# Patient Record
Sex: Male | Born: 1955 | Race: White | Hispanic: No | Marital: Married | Smoking: Never smoker
Health system: Southern US, Community
[De-identification: ages and names within clinical notes are randomized; demographics above are authoritative.]

## PROBLEM LIST (undated history)

## (undated) DIAGNOSIS — R55 Syncope and collapse: Secondary | ICD-10-CM

## (undated) DIAGNOSIS — J42 Unspecified chronic bronchitis: Secondary | ICD-10-CM

## (undated) DIAGNOSIS — I2510 Atherosclerotic heart disease of native coronary artery without angina pectoris: Secondary | ICD-10-CM

## (undated) DIAGNOSIS — Z8701 Personal history of pneumonia (recurrent): Secondary | ICD-10-CM

## (undated) DIAGNOSIS — I208 Other forms of angina pectoris: Secondary | ICD-10-CM

## (undated) DIAGNOSIS — E8881 Metabolic syndrome: Secondary | ICD-10-CM

## (undated) DIAGNOSIS — Z9289 Personal history of other medical treatment: Secondary | ICD-10-CM

## (undated) DIAGNOSIS — I2119 ST elevation (STEMI) myocardial infarction involving other coronary artery of inferior wall: Secondary | ICD-10-CM

## (undated) DIAGNOSIS — I10 Essential (primary) hypertension: Secondary | ICD-10-CM

## (undated) DIAGNOSIS — E785 Hyperlipidemia, unspecified: Secondary | ICD-10-CM

## (undated) DIAGNOSIS — N200 Calculus of kidney: Secondary | ICD-10-CM

## (undated) DIAGNOSIS — Z9861 Coronary angioplasty status: Secondary | ICD-10-CM

## (undated) DIAGNOSIS — E669 Obesity, unspecified: Secondary | ICD-10-CM

## (undated) HISTORY — DX: Personal history of pneumonia (recurrent): Z87.01

## (undated) HISTORY — DX: Coronary angioplasty status: Z98.61

## (undated) HISTORY — DX: Syncope and collapse: R55

## (undated) HISTORY — DX: Personal history of other medical treatment: Z92.89

## (undated) HISTORY — PX: KNEE ARTHROSCOPY: SHX127

## (undated) HISTORY — DX: Atherosclerotic heart disease of native coronary artery without angina pectoris: I25.10

---
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
## 1994-03-08 HISTORY — PX: ANAL SPHINCTEROTOMY: SHX1140

## 1997-03-08 HISTORY — PX: LITHOTRIPSY: SUR834

## 2001-03-08 DIAGNOSIS — Z8701 Personal history of pneumonia (recurrent): Secondary | ICD-10-CM

## 2001-03-08 HISTORY — DX: Personal history of pneumonia (recurrent): Z87.01

## 2003-08-05 ENCOUNTER — Emergency Department (HOSPITAL_COMMUNITY): Admission: EM | Admit: 2003-08-05 | Discharge: 2003-08-05 | Payer: Self-pay | Admitting: Emergency Medicine

## 2003-08-06 ENCOUNTER — Emergency Department (HOSPITAL_COMMUNITY): Admission: EM | Admit: 2003-08-06 | Discharge: 2003-08-06 | Payer: Self-pay | Admitting: Emergency Medicine

## 2005-07-08 ENCOUNTER — Ambulatory Visit: Payer: Self-pay | Admitting: Internal Medicine

## 2005-07-08 ENCOUNTER — Inpatient Hospital Stay (HOSPITAL_COMMUNITY): Admission: EM | Admit: 2005-07-08 | Discharge: 2005-07-09 | Payer: Self-pay | Admitting: Emergency Medicine

## 2005-07-13 ENCOUNTER — Ambulatory Visit: Payer: Self-pay

## 2005-07-13 ENCOUNTER — Inpatient Hospital Stay (HOSPITAL_COMMUNITY): Admission: EM | Admit: 2005-07-13 | Discharge: 2005-07-14 | Payer: Self-pay | Admitting: Emergency Medicine

## 2005-07-13 ENCOUNTER — Ambulatory Visit: Payer: Self-pay | Admitting: *Deleted

## 2007-11-27 ENCOUNTER — Ambulatory Visit (HOSPITAL_BASED_OUTPATIENT_CLINIC_OR_DEPARTMENT_OTHER): Admission: RE | Admit: 2007-11-27 | Discharge: 2007-11-27 | Payer: Self-pay | Admitting: Orthopedic Surgery

## 2009-06-04 ENCOUNTER — Encounter: Admission: RE | Admit: 2009-06-04 | Discharge: 2009-06-04 | Payer: Self-pay | Admitting: Family Medicine

## 2010-02-10 ENCOUNTER — Ambulatory Visit (HOSPITAL_BASED_OUTPATIENT_CLINIC_OR_DEPARTMENT_OTHER)
Admission: RE | Admit: 2010-02-10 | Discharge: 2010-02-10 | Payer: Self-pay | Source: Home / Self Care | Admitting: Family Medicine

## 2010-07-21 NOTE — Op Note (Signed)
[name], Jason Mathis                  ACCOUNT NO.:  [id number]   MEDICAL RECORD NO.:  [id number]          PATIENT TYPE:  AMB   LOCATION:  DSC                          FACILITY:  [facility]   PHYSICIAN:  Feliberto Gottron. Turner Daniels, M.D.   DATE OF BIRTH:  1955/11/08   DATE OF PROCEDURE:  11/27/2007  DATE OF DISCHARGE:                               OPERATIVE REPORT   PREOPERATIVE DIAGNOSIS:  Left knee medial meniscal tear.   POSTOPERATIVE DIAGNOSIS:  Left knee medial meniscal tear.   PROCEDURE:  Left knee partial arthroscopic medial meniscectomy.   SURGEON:  Feliberto Gottron. Turner Daniels, MD   FIRST ASSISTANT:  None.   ANESTHETIC:  General LMA.   ESTIMATED BLOOD LOSS:  Minimal.   FLUID REPLACEMENT:  500 mL of crystalloid.   DRAINS PLACED:  None.   TOURNIQUET TIME:  None.   INDICATIONS FOR PROCEDURE:  A 55 year old gentleman with mechanical  catching, popping, and pain along the medial aspect of his left knee.  He has failed conservative treatment with anti-inflammatory medicines,  exercise observation, cortisone injection, and desires elective left  knee arthroscopic evaluation and treatment for a presumed medial  meniscal tear.  Risks and benefits of surgery discussed, questions  answered.   DESCRIPTION OF PROCEDURE:  The patient identified by armband, taken to  the operating room at [facility].  Appropriate anesthetic  monitors were attached and general LMA anesthesia induced with the  patient in supine position.  Lateral post was applied to the table and  the left lower extremity prepped and draped in usual sterile fashion  from the ankle to the midthigh.  He did receive preoperative IV  antibiotics and a standard time-out procedure was then performed.  Using  a #11 blade, standard inferomedial and inferolateral peripatellar  portals were then made allowing introduction of the arthroscope through  the inferolateral portal and the outflow through the inferomedial  portal.  Suprapatellar  pouch, patella, and trochlear were in excellent  condition.  Moving the medial side, the patient had a complex tear of  the posterior horn of the medial meniscus that looked like a bucket-  handle, it had been a shooting half.  This was removed with a 3.5 Gator  sucker shaver and straight biters, large and small.  We then thoroughly  probed the remnant of the meniscus and the rest of it was intact.  We  removed about 30% of the posterior horn.  He had mild chondromalacia of  the medial compartment, the ACL and PCL were intact on the  lateral side.  The articular and meniscal cartilages were in good  condition.  The gutters were cleared medially and laterally.  The knee  irrigated out with normal saline solution.  The arthroscopic instruments  were then removed and a dressing of Xeroform, 4 x 4 dressing sponges,  Webril, and Ace wrap were applied.      Feliberto Gottron. Turner Daniels, M.D.  Electronically Signed     FJR/MEDQ  D:  11/27/2007  T:  11/28/2007  Job:  [id number]

## 2010-07-24 NOTE — Discharge Summary (Signed)
NAMEISIDRO, MONKS                  ACCOUNT NO.:  [id number]   MEDICAL RECORD NO.:  [id number]          PATIENT TYPE:  INP   LOCATION:  3735                         FACILITY:  [facility]   PHYSICIAN:  Salvadore Farber, M.D. LHCDATE OF BIRTH:  1956/02/06   DATE OF ADMISSION:  07/08/2005  DATE OF DISCHARGE:  07/09/2005                                 DISCHARGE SUMMARY   PRIMARY CARDIOLOGIST:  Dr. Dietrich Pates.   PRINCIPAL DIAGNOSIS:  Chest pain.   OTHER DIAGNOSES:  1.  Seasonal allergies.  2.  Remote history of migraines.  3.  Remote history of nephrolithiasis.  4.  History of right knee surgery.  5.  History of sphincterotomy.  6.  History of lithotripsy.   ALLERGIES:  NO KNOWN DRUG ALLERGIES.   PROCEDURES:  None.   HISTORY OF PRESENT ILLNESS:  [age]-year-old married white male with no  prior history of coronary artery disease who was in his usual state of  health on Jul 08, 2005, when he had the sudden onset of substernal chest  tightness radiating up to the right, which was not pleuritic or positional.  There were no associated symptoms.  After about 5 or 10 minutes, symptoms  increased in intensity, prompting him to ask a friend to drive him to the  emergency room.  In the ED, he was treated with a GI cocktail with some  relief.  Decision was made to admit him and rule him out for MI.   HOSPITAL COURSE:  The patient ruled out for MI by cardiac markers.  ECG was  without acute changes.  This morning, he is not having any additional chest  discomfort.  He is being discharged home today in satisfactory condition.  He has been set up to have an exercise Myoview on May 8th, and will follow  up with Dr. Tenny Craw regarding that result.   DISCHARGE LABS:  Hemoglobin 15.3, hematocrit 45.0.  Sodium 135, potassium  3.9, chloride 104, CO2 22, BUN 10, creatinine 0.9, glucose 78, total  bilirubin 0.9, alkaline phosphatase 51, AST 23, ALT 26, total protein 6.4,  albumin 4.2, calcium 8.6.   Cardiac markers negative x3.  Total cholesterol  226, triglycerides 159, HDL 33, LDL 161.  TSH 1.020.   DISPOSITION:  The patient is being discharged home today in good condition.   FOLLOW UP PLANS AND APPOINTMENTS:  He has an exercise Myoview scheduled on  Jul 13, 2005, at 11:45 a.m., and will follow up Dr. Dietrich Pates on May 18th at  3:30 p.m..   DISCHARGE MEDICATIONS:  Aspirin 81 mg daily, Protonix 4 mg daily,  nitroglycerin 0.4 mg sublingual p.r.n. chest pain.   OUTSTANDING LAB STUDIES:  None.   DURATION OF DISCHARGE ENCOUNTER:  Thirty-five minutes including physician  time.      Ok Anis, NP      Salvadore Farber, M.D. [facility]  Electronically Signed    CRB/MEDQ  D:  07/09/2005  T:  07/10/2005  Job:  [id number]

## 2010-07-24 NOTE — Cardiovascular Report (Signed)
NAMEDURWOOD, DITTUS                  ACCOUNT NO.:  [id number]   MEDICAL RECORD NO.:  [id number]          PATIENT TYPE:  INP   LOCATION:  2899                         FACILITY:  [facility]   PHYSICIAN:  Salvadore Farber, M.D. LHCDATE OF BIRTH:  06-Jan-1956   DATE OF PROCEDURE:  07/14/2005  DATE OF DISCHARGE:  07/14/2005                              CARDIAC CATHETERIZATION   PROCEDURE:  1.  Left heart catheterization.  2.  Left ventriculography.  3.  Coronary angiography.   INDICATIONS:  Mr. Mooney is a 55 year old gentleman who presented last week  with atypical chest discomfort.  He ruled out for myocardial infarction by  serial enzymes and electrocardiograms.  He presented to the office yesterday  for planned stress test.  At that time he complained of a few more episodes  of atypical chest discomfort.  Dr. Corinda Gubler evaluated him in the office and  felt the patient would be better served by invasive evaluation including  cardiac catheterization.  He was therefore admitted to hospital and presents  this morning for diagnostic angiography.   PROCEDURAL TECHNIQUE:  Informed consent was obtained.  Under 1% lidocaine  local anesthesia a 5-French sheath was placed in the right common femoral  artery using the modified Seldinger technique.  Diagnostic angiography and  ventriculography were performed using JL4, JR4, and pigtail catheters.  The  patient was then transferred to the holding room in stable condition.  Sheath will be removed there.   COMPLICATIONS:  None.   FINDINGS:  1.  LV:  97/2/6.  EF 65% without regional wall motion abnormality.  2.  No aortic stenosis or mitral regurgitation.  3.  Left main:  Angiographically normal short vessel.  4.  LAD:  Large vessel which wraps the apex of the heart and gives rise to a      single large diagonal.  It is angiographically normal.  5.  Ramus intermedius:  Very large vessel which is angiographically normal.  6.  Circumflex:  Small vessel  giving rise to a single small obtuse marginal.      It is angiographically normal.  7.  RCA:  Moderate-sized dominant vessel.  It is angiographically normal.   IMPRESSION/RECOMMENDATIONS:  1.  Angiographically normal coronary arteries.  2.  Normal left ventricular size and systolic function.  3.  No aortic stenosis or mitral regurgitation.   I think the patient's chest discomforts are best explained by  gastroesophageal reflux.  Will continue proton-pump inhibitor and have him  follow up with a gastroenterologist.      Salvadore Farber, M.D. [facility]  Electronically Signed     WED/MEDQ  D:  07/14/2005  T:  07/15/2005  Job:  [id number]

## 2010-07-24 NOTE — H&P (Signed)
Jason Mathis                  ACCOUNT NO.:  [id number]   MEDICAL RECORD NO.:  [id number]          PATIENT TYPE:  INP   LOCATION:  1823                         FACILITY:  [facility]   PHYSICIAN:  Pricilla Riffle, M.D.    DATE OF BIRTH:  26-Sep-1955   DATE OF ADMISSION:  07/08/2005  DATE OF DISCHARGE:                                HISTORY & PHYSICAL   CHIEF COMPLAINT:  Chest pain.   HISTORY OF PRESENT ILLNESS:  Jason Mathis is a 55 year old male with no  previous history of coronary artery disease.  At approximately noon, he was  working at his computer and had sudden-onset of substernal chest tightness  that radiated over to the right.  He is not able to quantify it, but states  he was very uncomfortable.  The pain was not pleuritic and nonpositional.  There was no associated nausea, vomiting, shortness of breath.  He had no  diaphoresis.  At an increased level of intensity, it lasted 5-10 minutes.  A  friend brought him to the emergency room.  He has never had this pain  before.  He does not exercise regularly, but does yard work on the weekend.  There has been no change in his GI system and his bowel or bladder habits.  He is currently complaining of chest pain and says he can barely feel it.Marland Kitchen   PAST MEDICAL HISTORY:  1.  Seasonal allergies.  2.  Remote history of migraines.  3.  Remote history of nephrolithiasis.  4.  He does not get regular medical checkups, but has no known history of      diabetes, hypertension or hyperlipidemia.   ALLERGIES:  No known drug allergies.   CURRENT MEDICATIONS:  Claritin over-the-counter 10 mg a day p.r.n.   PAST SURGICAL HISTORY:  1.  Right knee surgery.  2.  Sphincterotomy.  3.  Lithotripsy.   SOCIAL HISTORY:  He lives in [place] with his wife and works at [employer].  He uses smokeless tobacco.  He does not abuse alcohol or drugs.   FAMILY HISTORY:  His mother is alive at age 80 without a history of heart  disease.  His father died at age  3 of an MI and had a history of tobacco  use.  He has no siblings with coronary artery disease.   REVIEW OF SYSTEMS:  GENITOURINARY:  He denies hematemesis, hemoptysis or  melena.  CONSTITUTIONAL:  He has had no recent fevers or chills.  No  illnesses of which he is aware.  He has no recent weight change.  Denies  arthralgias, cough or wheezing.  Review of systems is otherwise negative.   PHYSICAL EXAMINATION:  VITAL SIGNS:  His temperature is 98.4 with an initial  blood pressure of 139/93, heart rate 93, respiratory rate 18, O2 saturation  96% on room air.  GENERAL:  He is a well-developed, well-nourished, white male in no acute  distress.  HEENT:  Head is normocephalic and atraumatic with pupils equal, round,  reactive to light accommodation, extraocular movements intact.  Sclera  clear.  Nares  without discharge.  NECK:  Supple and there is no lymphadenopathy, thyromegaly or bruit noted.  He has no JVD and his JVP is normal.  CARDIAC:  Heart is regular rate and rhythm with an S1-S2.  He has no S3-S4  or murmur noted.  LUNGS:  Clear to auscultation bilaterally.  SKIN:  No rashes or lesions are noted.  ABDOMEN:  Soft and nontender with active bowel sounds.  EXTREMITIES:  There is no cyanosis, clubbing or edema and he has 2+ pulses  in all four extremities.  MUSCULOSKELETAL:  There is no joint deformity or effusions and no spine or  CVA tenderness.  NEUROLOGIC:  He is alert and oriented.  Cranial nerves 2-12 grossly intact.   LABORATORY DATA AND X-RAY FINDINGS:  Chest x-ray pending at the time of  dictation.  EKG is sinus rhythm with no acute ischemic changes.   D-dimer is less than 0.22.  Sodium 135, potassium 5.4, chloride 107, BUN 12,  glucose 95, creatinine 0.9.  Point of care markers are negative x1.   IMPRESSION:  Chest pain.  Jason Mathis is a 55 year old male with no known  history of coronary artery disease.  He does not exercise regularly.  He had  an episode of chest  tightness today while at his computer.  It was not  associated with shortness of breath, nausea, vomiting or diaphoresis.  At  the time, he had not eaten lunch.  He is not under extreme distress.  He was  concerned enough about his symptoms to come the emergency room.  His  electrocardiogram was negative and his exam was unremarkable.   PLAN:  The plan will be to observe him overnight and rule out for MI.  He  will have a aspirin, Protonix and low-dose beta blocker added to his  medication regimen.  He will be given sublingual nitroglycerin to see if  this affects his symptoms.  A lipid profile will be ordered for in the  morning.  His cardiac enzymes are elevated.  Cardiac catheterization is  indicated.  If his cardiac enzymes are negative, he can be discharged  tomorrow with an outpatient Myoview.      Jason Mathis, P.A. [facility]    ______________________________  Pricilla Riffle, M.D.    RB/MEDQ  D:  07/08/2005  T:  07/08/2005  Job:  [id number]

## 2010-07-24 NOTE — Discharge Summary (Signed)
NAMEHASKELL, Jason Mathis                  ACCOUNT NO.:  [id number]   MEDICAL RECORD NO.:  [id number]          PATIENT TYPE:  INP   LOCATION:  2899                         FACILITY:  [facility]   PHYSICIAN:  Salvadore Farber, M.D. LHCDATE OF BIRTH:  1955/09/02   DATE OF ADMISSION:  07/13/2005  DATE OF DISCHARGE:  07/14/2005                                 DISCHARGE SUMMARY   PRIMARY CARDIOLOGIST:  Pricilla Riffle, M.D.   PRINCIPAL DIAGNOSES:  1.  Noncardiac chest pain.      1.  Normal coronary angiogram Jul 14, 2005.  2.  Hyperlipidemia.  3.  Mildly elevated liver function tests.   SECONDARY DIAGNOSES:  1.  History of migraines.  2.  History of nephrolithiasis.      1.  Status post lithotripsy.   PROCEDURE:  Coronary angiogram by Salvadore Farber, M.D., Jul 14, 2005:  Normal coronary angiogram with normal left ventricular function (EF 65%)  with no wall motion abnormalities.   REASON FOR ADMISSION:  [name] is a 55 year old male, recently briefly  hospitalized here just a few days ago with chest pain, who ruled out for  myocardial infarction, and presented for scheduled outpatient stress test.  However, baseline electrocardiogram suggested new inferolateral T-wave  abnormalities and, following review by Dr. Sarajane Jews, patient was  admitted directly from the office for further evaluation with cardiac  catheterization.   HOSPITAL COURSE:  Following rule out of myocardial infarction with all  serial cardiac markers within normal limits, patient was cleared to proceed  with cardiac catheterization.   Coronary angiography was normal as was left ventricular function with no  wall motion abnormalities.   Dr. Samule Ohm concluded that the etiology of the chest discomfort was most  likely secondary to GERD.  Recommendation was for patient to establish with  her primary care physician for further evaluation and management.  Patient  was otherwise cleared for discharge later that same day.   DISCHARGE MEDICATIONS:  1.  Protonix 40 mg daily.  2.  Baby aspirin 81 mg daily.   DISCHARGE INSTRUCTIONS:  1.  Refer to cardiac catheterization sheet for detailed instructions.  2.  Patient is instructed to contact our office if he notes any      bleeding/swelling of the right groin incision site.   FOLLOW UP:  Patient instructed to establish with her primary care physician.   LABORATORY DATA:  Serial cardiac markers normal.  Lipid profile:  Total  cholesterol 211, triglycerides 120, HDL 38, LDL 149.  Normal metabolic  profile.  Minimally elevated SGPT, otherwise normal liver enzymes.  CBC  normal.   DISPOSITION:  Stable.   DURATION OF DISCHARGE ENCOUNTER:  Less than 30 minutes.      Rozell Searing, P.A. [facility]      Salvadore Farber, M.D. [facility]  Electronically Signed    GS/MEDQ  D:  07/14/2005  T:  07/15/2005  Job:  [id number]

## 2011-07-14 ENCOUNTER — Inpatient Hospital Stay (HOSPITAL_COMMUNITY)
Admission: EM | Admit: 2011-07-14 | Discharge: 2011-07-17 | DRG: 853 | Disposition: A | Discharge status: Home / Self Care | Payer: BC Managed Care – PPO | Source: Ambulatory Visit | Attending: Cardiology | Admitting: Cardiology

## 2011-07-14 ENCOUNTER — Encounter (HOSPITAL_COMMUNITY)
Admission: EM | Disposition: A | Discharge status: Home / Self Care | Payer: Self-pay | Source: Ambulatory Visit | Attending: Cardiology

## 2011-07-14 ENCOUNTER — Encounter (HOSPITAL_COMMUNITY): Payer: Self-pay | Admitting: Emergency Medicine

## 2011-07-14 DIAGNOSIS — Z8249 Family history of ischemic heart disease and other diseases of the circulatory system: Secondary | ICD-10-CM

## 2011-07-14 DIAGNOSIS — E785 Hyperlipidemia, unspecified: Secondary | ICD-10-CM | POA: Diagnosis present

## 2011-07-14 DIAGNOSIS — I2510 Atherosclerotic heart disease of native coronary artery without angina pectoris: Secondary | ICD-10-CM | POA: Diagnosis present

## 2011-07-14 DIAGNOSIS — Z955 Presence of coronary angioplasty implant and graft: Secondary | ICD-10-CM

## 2011-07-14 DIAGNOSIS — I2119 ST elevation (STEMI) myocardial infarction involving other coronary artery of inferior wall: Principal | ICD-10-CM | POA: Diagnosis present

## 2011-07-14 DIAGNOSIS — E8881 Metabolic syndrome: Secondary | ICD-10-CM | POA: Diagnosis present

## 2011-07-14 DIAGNOSIS — E669 Obesity, unspecified: Secondary | ICD-10-CM | POA: Diagnosis present

## 2011-07-14 DIAGNOSIS — Z6830 Body mass index (BMI) 30.0-30.9, adult: Secondary | ICD-10-CM

## 2011-07-14 DIAGNOSIS — I213 ST elevation (STEMI) myocardial infarction of unspecified site: Secondary | ICD-10-CM | POA: Diagnosis present

## 2011-07-14 DIAGNOSIS — Z9861 Coronary angioplasty status: Secondary | ICD-10-CM | POA: Diagnosis present

## 2011-07-14 HISTORY — DX: Calculus of kidney: N20.0

## 2011-07-14 HISTORY — PX: LEFT HEART CATHETERIZATION WITH CORONARY ANGIOGRAM: SHX5451

## 2011-07-14 HISTORY — DX: Atherosclerotic heart disease of native coronary artery without angina pectoris: I25.10

## 2011-07-14 HISTORY — PX: PERCUTANEOUS CORONARY STENT INTERVENTION (PCI-S): SHX5485

## 2011-07-14 LAB — CBC
HCT: 41.2 % (ref 39.0–52.0)
Hemoglobin: 14 g/dL (ref 13.0–17.0)
MCV: 86.4 fL (ref 78.0–100.0)
RDW: 14 % (ref 11.5–15.5)
WBC: 6.6 10*3/uL (ref 4.0–10.5)

## 2011-07-14 LAB — COMPREHENSIVE METABOLIC PANEL
Albumin: 3.5 g/dL (ref 3.5–5.2)
Alkaline Phosphatase: 65 U/L (ref 39–117)
BUN: 14 mg/dL (ref 6–23)
Chloride: 107 mEq/L (ref 96–112)
Creatinine, Ser: 0.68 mg/dL (ref 0.50–1.35)
GFR calc Af Amer: >90 mL/min (ref >90)
GFR calc non Af Amer: >90 mL/min (ref >90)
Glucose, Bld: 129 mg/dL — ABNORMAL HIGH (ref 70–99)
Potassium: 4 mEq/L (ref 3.5–5.1)
Total Bilirubin: 0.2 mg/dL — ABNORMAL LOW (ref 0.3–1.2)

## 2011-07-14 LAB — APTT: aPTT: 26 seconds (ref 24–37)

## 2011-07-14 LAB — PROTIME-INR
INR: 0.85 (ref 0.00–1.49)
Prothrombin Time: 11.8 seconds (ref 11.6–15.2)

## 2011-07-14 SURGERY — LEFT HEART CATHETERIZATION WITH CORONARY ANGIOGRAM
Anesthesia: LOCAL

## 2011-07-14 MED ORDER — HEPARIN SODIUM (PORCINE) 5000 UNIT/ML IJ SOLN: 4000.0000 [IU] | INTRAMUSCULAR | Status: AC

## 2011-07-14 MED ORDER — ASPIRIN 81 MG PO CHEW
81.0000 mg | CHEWABLE_TABLET | Freq: Every day | ORAL | Status: DC
  Administered 2011-07-15 – 2011-07-17 (×3): 81 mg via ORAL
  Filled 2011-07-14 (×3): qty 1

## 2011-07-14 MED ORDER — HEPARIN SODIUM (PORCINE) 5000 UNIT/ML IJ SOLN
INTRAMUSCULAR | Status: AC
  Administered 2011-07-14: 4000 [IU]
  Filled 2011-07-14: qty 1

## 2011-07-14 MED ORDER — LIDOCAINE HCL (PF) 1 % IJ SOLN
INTRAMUSCULAR | Status: AC
  Filled 2011-07-14: qty 30

## 2011-07-14 MED ORDER — FENTANYL CITRATE 0.05 MG/ML IJ SOLN
INTRAMUSCULAR | Status: AC
  Filled 2011-07-14: qty 2

## 2011-07-14 MED ORDER — SODIUM CHLORIDE 0.9 % IV SOLN: 250.0000 mL | INTRAVENOUS | Status: DC

## 2011-07-14 MED ORDER — SODIUM CHLORIDE 0.9 % IJ SOLN: 3.0000 mL | Freq: Two times a day (BID) | INTRAMUSCULAR | Status: DC

## 2011-07-14 MED ORDER — SODIUM CHLORIDE 0.9 % IJ SOLN: 3.0000 mL | INTRAMUSCULAR | Status: DC | PRN

## 2011-07-14 MED ORDER — BIVALIRUDIN 250 MG IV SOLR
INTRAVENOUS | Status: AC
  Filled 2011-07-14: qty 250

## 2011-07-14 MED ORDER — NITROGLYCERIN 0.2 MG/ML ON CALL CATH LAB
INTRAVENOUS | Status: AC
  Filled 2011-07-14: qty 1

## 2011-07-14 MED ORDER — PRASUGREL HCL 10 MG PO TABS
ORAL_TABLET | ORAL | Status: AC
  Filled 2011-07-14: qty 6

## 2011-07-14 MED ORDER — MORPHINE SULFATE 2 MG/ML IJ SOLN: 2.0000 mg | INTRAMUSCULAR | Status: DC | PRN

## 2011-07-14 MED ORDER — NITROGLYCERIN IN D5W 200-5 MCG/ML-% IV SOLN
INTRAVENOUS | Status: AC
  Filled 2011-07-14: qty 250

## 2011-07-14 MED ORDER — ACETAMINOPHEN 325 MG PO TABS: 650.0000 mg | ORAL_TABLET | ORAL | Status: DC | PRN

## 2011-07-14 MED ORDER — ASPIRIN 81 MG PO CHEW: 324.0000 mg | CHEWABLE_TABLET | Freq: Once | ORAL | Status: DC

## 2011-07-14 MED ORDER — SODIUM CHLORIDE 0.9 % IV SOLN: INTRAVENOUS | Status: DC

## 2011-07-14 MED ORDER — MIDAZOLAM HCL 2 MG/2ML IJ SOLN
INTRAMUSCULAR | Status: AC
  Filled 2011-07-14: qty 2

## 2011-07-14 MED ORDER — ONDANSETRON HCL 4 MG/2ML IJ SOLN: 4.0000 mg | Freq: Four times a day (QID) | INTRAMUSCULAR | Status: DC | PRN

## 2011-07-14 MED ORDER — METOPROLOL TARTRATE 1 MG/ML IV SOLN
INTRAVENOUS | Status: AC
  Filled 2011-07-14: qty 10

## 2011-07-14 MED ORDER — SODIUM CHLORIDE 0.9 % IV SOLN
1.0000 mL/kg/h | INTRAVENOUS | Status: AC
  Administered 2011-07-15: 1 mL/kg/h via INTRAVENOUS

## 2011-07-14 MED ORDER — METOPROLOL TARTRATE 12.5 MG HALF TABLET
12.5000 mg | ORAL_TABLET | Freq: Two times a day (BID) | ORAL | Status: DC
  Administered 2011-07-15 (×2): 12.5 mg via ORAL
  Filled 2011-07-14 (×3): qty 1

## 2011-07-14 MED ORDER — HEPARIN (PORCINE) IN NACL 2-0.9 UNIT/ML-% IJ SOLN
INTRAMUSCULAR | Status: AC
  Filled 2011-07-14: qty 1000

## 2011-07-14 MED ORDER — PRASUGREL HCL 10 MG PO TABS
10.0000 mg | ORAL_TABLET | Freq: Every day | ORAL | Status: DC
  Administered 2011-07-15 – 2011-07-17 (×3): 10 mg via ORAL
  Filled 2011-07-14 (×4): qty 1

## 2011-07-14 MED ORDER — ATORVASTATIN CALCIUM 40 MG PO TABS
40.0000 mg | ORAL_TABLET | Freq: Every day | ORAL | Status: DC
  Administered 2011-07-15 – 2011-07-16 (×2): 40 mg via ORAL
  Filled 2011-07-14 (×4): qty 1

## 2011-07-14 NOTE — H&P (Signed)
Jason Mathis is an 56 y.o. male.   Chief Complaint:  Burning Sensation is the Chest HPI:   The patient is a 56 yo obese male with a history of kidney stones but no other significant medical history.  His father died at age 86 of an MI.  He did have a clean colonoscopy about three years ago.  The patient states he developed a burning sensation in his chest which was more intense than previous heart burn issues.  It seemed to spread from a central location.  He also reports diaphoresis, nausea.  He also became very dizzy and thought he was going to pass out.  He denies SOB, vomiting, cough, congestion, abdominal pain, dysuria, hematuria, melena.  Past Medical History  Diagnosis Date  . Kidney stones     Past Surgical History  Procedure Date  . Angiolasty     No family history on file. Social History:  reports that he has never smoked. He does not have any smokeless tobacco history on file. He reports that he does not drink alcohol or use illicit drugs.  Allergies: No Known Allergies  Medications Prior to Admission  Medication Sig Dispense Refill  . Aspirin-Acetaminophen-Caffeine (GOODYS EXTRA STRENGTH PO) Take 1 Package by mouth as needed. For headaches        Results for orders placed during the hospital encounter of 07/14/11 (from the past 48 hour(s))  APTT     Status: Normal   Collection Time   07/14/11 10:01 PM      Component Value Range Comment   aPTT 26  24 - 37 (seconds)   CBC     Status: Normal   Collection Time   07/14/11 10:01 PM      Component Value Range Comment   WBC 6.6  4.0 - 10.5 (K/uL)    RBC 4.77  4.22 - 5.81 (MIL/uL)    Hemoglobin 14.0  13.0 - 17.0 (g/dL)    HCT 64.4  03.4 - 74.2 (%)    MCV 86.4  78.0 - 100.0 (fL)    MCH 29.4  26.0 - 34.0 (pg)    MCHC 34.0  30.0 - 36.0 (g/dL)    RDW 59.5  63.8 - 75.6 (%)    Platelets 190  150 - 400 (K/uL)   COMPREHENSIVE METABOLIC PANEL     Status: Abnormal   Collection Time   07/14/11 10:01 PM      Component Value Range  Comment   Sodium 140  135 - 145 (mEq/L)    Potassium 4.0  3.5 - 5.1 (mEq/L)    Chloride 107  96 - 112 (mEq/L)    CO2 21  19 - 32 (mEq/L)    Glucose, Bld 129 (*) 70 - 99 (mg/dL)    BUN 14  6 - 23 (mg/dL)    Creatinine, Ser 4.33  0.50 - 1.35 (mg/dL)    Calcium 8.2 (*) 8.4 - 10.5 (mg/dL)    Total Protein 6.3  6.0 - 8.3 (g/dL)    Albumin 3.5  3.5 - 5.2 (g/dL)    AST 27  0 - 37 (U/L)    ALT 34  0 - 53 (U/L)    Alkaline Phosphatase 65  39 - 117 (U/L)    Total Bilirubin 0.2 (*) 0.3 - 1.2 (mg/dL)    GFR calc non Af Amer >90  >90 (mL/min)    GFR calc Af Amer >90  >90 (mL/min)   PROTIME-INR     Status: Normal   Collection  Time   07/14/11 10:01 PM      Component Value Range Comment   Prothrombin Time 11.8  11.6 - 15.2 (seconds)    INR 0.85  0.00 - 1.49     No results found.  Review of Systems  Constitutional: Positive for diaphoresis. Negative for fever.  HENT: Negative for congestion.   Eyes: Negative for double vision.  Respiratory: Negative for cough and shortness of breath.   Cardiovascular: Positive for chest pain. Negative for leg swelling.  Gastrointestinal: Positive for nausea. Negative for vomiting, abdominal pain, diarrhea and melena.  Genitourinary: Negative for dysuria and hematuria.  Neurological: Positive for dizziness (He thought he was going to pass out.).    Blood pressure 111/70, pulse 92, temperature 97.9 F (36.6 C), temperature source Oral, resp. rate 18, height 6\' 2"  (1.88 m), weight 113.399 kg (250 lb), SpO2 93.00%. Physical Exam  Constitutional: He is oriented to person, place, and time. He appears well-developed. No distress.       Obese  HENT:  Head: Normocephalic and atraumatic.  Eyes: EOM are normal. Pupils are equal, round, and reactive to light. No scleral icterus.  Neck: Normal range of motion.  Cardiovascular: Normal rate, regular rhythm, S1 normal and S2 normal.   No murmur heard. Pulses:      Radial pulses are 2+ on the right side, and 2+ on the  left side.       Dorsalis pedis pulses are 2+ on the right side, and 2+ on the left side.       No carotid bruits.  Respiratory: Effort normal and breath sounds normal. He has no rales.  GI: Soft. Bowel sounds are normal. There is no tenderness.  Musculoskeletal: He exhibits no edema.  Neurological: He is alert and oriented to person, place, and time.  Skin: Skin is warm and dry.  Psychiatric: He has a normal mood and affect.     Assessment/Plan Patient Active Hospital Problem List: STEMI (ST elevation myocardial infarction), inferior (07/14/2011)  Plan:  The patient was taken emergently to the cath lab.  Jason Mathis 07/14/2011, 11:22 PM  I saw and examined the patient in the emergency room prior to performing the cardiac catheterization and PCI. I was present with Jason Mathis during the interview phase post-PCI. Agree with his findings and assessment. He was taken emergently to the Cardiac Catheterization Lab where he underwent PCI of the RCA With a Promus Drug-Eluting Stent. He also has an LAD lesion of roughly 80% in the proximal to midportion and a diagonal bifurcation that will likely require staged PCI but can be done at a later date after adequate time to recover from his MI. Please see complete  Cardiac Catheterization/PCI Note for full details. Anticipate fast Track Discharge.   [name], M.D., M.S. [facility] [address] [place], [place]  [address]  [phone] Pager # [phone]  07/15/2011 1:02 AM

## 2011-07-14 NOTE — ED Notes (Signed)
DR. [name] AT BEDSIDE SPEAKING WITH PT. AND  FAMILY.

## 2011-07-14 NOTE — ED Notes (Signed)
PT. ARRIVED WITH EMS FROM HOME , REPORTS "BURNING SENSATION " / MID CHEST PAIN THIS EVENING , NON RADIATING , NO SOB , SLIGHT DIAPHORESIS , PT. RECEIVED 4 BABY ASA AND 1 NTG SL PTA WITH RELIEF. CODE STEMI CALLED BY DR. ZAMMIT AFTER REVIEWING EKG.

## 2011-07-14 NOTE — ED Provider Notes (Signed)
History     CSN: [id number]  Arrival date & time 07/14/11  2143   First MD Initiated Contact with Patient 07/14/11 2154      Chief Complaint  Patient presents with  . Chest Pain    (Consider location/radiation/quality/duration/timing/severity/associated sxs/prior treatment) Patient is a 56 y.o. male presenting with chest pain. The history is provided by the patient (pt started with chest pain around 830pm today with sweating.  pt given aspirin and nitro by paramedics). No language interpreter was used.  Chest Pain The chest pain began 1 - 2 hours ago. Chest pain occurs constantly. The chest pain is improving. Associated with: nothing. At its most intense, the pain is at 6/10. The pain is currently at 2/10. The severity of the pain is moderate. The quality of the pain is described as dull. The pain does not radiate. Exacerbated by: nothing. Primary symptoms include shortness of breath. Pertinent negatives for primary symptoms include no fatigue, no cough and no abdominal pain.  Pertinent negatives for past medical history include no seizures.     Past Medical History  Diagnosis Date  . Kidney stones     Past Surgical History  Procedure Date  . Angiolasty     No family history on file.  History  Substance Use Topics  . Smoking status: Never Smoker   . Smokeless tobacco: Not on file  . Alcohol Use: No      Review of Systems  Constitutional: Negative for fatigue.  HENT: Negative for congestion, sinus pressure and ear discharge.   Eyes: Negative for discharge.  Respiratory: Positive for shortness of breath. Negative for cough.   Cardiovascular: Positive for chest pain.  Gastrointestinal: Negative for abdominal pain and diarrhea.  Genitourinary: Negative for frequency and hematuria.  Musculoskeletal: Negative for back pain.  Skin: Negative for rash.  Neurological: Negative for seizures and headaches.  Hematological: Negative.   Psychiatric/Behavioral: Negative for  hallucinations.    Allergies  Review of patient's allergies indicates no known allergies.  Home Medications   Current Outpatient Rx  Name Route Sig Dispense Refill  . GOODYS EXTRA STRENGTH PO Oral Take 1 Package by mouth as needed. For headaches      BP 120/83  Pulse 94  Temp(Src) 97.9 F (36.6 C) (Oral)  Resp 22  SpO2 91%  Physical Exam  Constitutional: He is oriented to person, place, and time. He appears well-developed.  HENT:  Head: Normocephalic and atraumatic.  Eyes: Conjunctivae and EOM are normal. No scleral icterus.  Neck: Neck supple. No thyromegaly present.  Cardiovascular: Normal rate and regular rhythm.  Exam reveals no gallop and no friction rub.   No murmur heard. Pulmonary/Chest: No stridor. He has no wheezes. He has no rales. He exhibits no tenderness.  Abdominal: He exhibits no distension. There is no tenderness. There is no rebound.  Musculoskeletal: Normal range of motion. He exhibits no edema.  Lymphadenopathy:    He has no cervical adenopathy.  Neurological: He is oriented to person, place, and time. Coordination normal.  Skin: No rash noted. No erythema.  Psychiatric: He has a normal mood and affect. His behavior is normal.    ED Course  Procedures (including critical care time)  Labs Reviewed - No data to display No results found.   No diagnosis found.   Date: 07/14/2011  Rate:89  Rhythm: normal sinus rhythm  QRS Axis: left  Intervals: normal  ST/T Wave abnormalities: acute myocardial infarction  Conduction Disutrbances:none  Narrative Interpretation:   Old EKG  Reviewed: none available    MDM  stemi called and card. To see pt        Benny Lennert, MD 07/14/11 2204

## 2011-07-14 NOTE — ED Notes (Signed)
DR. Charm Barges / PHARMACIST AT BEDSIDE SPEAKING WITH PT /FAMILY ON PLAN OF CARE . PT. READY TO GO TO CATH LAB , WAITNG FOR NOTIFICATION FROM CATH LAB.

## 2011-07-15 ENCOUNTER — Encounter (HOSPITAL_COMMUNITY): Payer: Self-pay | Admitting: *Deleted

## 2011-07-15 DIAGNOSIS — Z9289 Personal history of other medical treatment: Secondary | ICD-10-CM

## 2011-07-15 DIAGNOSIS — E785 Hyperlipidemia, unspecified: Secondary | ICD-10-CM | POA: Diagnosis present

## 2011-07-15 DIAGNOSIS — I2119 ST elevation (STEMI) myocardial infarction involving other coronary artery of inferior wall: Secondary | ICD-10-CM

## 2011-07-15 HISTORY — DX: Hyperlipidemia, unspecified: E78.5

## 2011-07-15 HISTORY — DX: Personal history of other medical treatment: Z92.89

## 2011-07-15 HISTORY — PX: TRANSTHORACIC ECHOCARDIOGRAM: SHX275

## 2011-07-15 HISTORY — DX: ST elevation (STEMI) myocardial infarction involving other coronary artery of inferior wall: I21.19

## 2011-07-15 LAB — COMPREHENSIVE METABOLIC PANEL
BUN: 14 mg/dL (ref 6–23)
CO2: 24 mEq/L (ref 19–32)
Chloride: 106 mEq/L (ref 96–112)
Creatinine, Ser: 0.69 mg/dL (ref 0.50–1.35)
GFR calc non Af Amer: >90 mL/min (ref >90)
Total Bilirubin: 0.2 mg/dL — ABNORMAL LOW (ref 0.3–1.2)

## 2011-07-15 LAB — CARDIAC PANEL(CRET KIN+CKTOT+MB+TROPI)
CK, MB: 44.1 ng/mL (ref 0.3–4.0)
CK, MB: 50.4 ng/mL (ref 0.3–4.0)
Relative Index: 12.5 — ABNORMAL HIGH (ref 0.0–2.5)
Relative Index: 11 — ABNORMAL HIGH (ref 0.0–2.5)
Total CK: 352 U/L — ABNORMAL HIGH (ref 7–232)
Total CK: 602 U/L — ABNORMAL HIGH (ref 7–232)
Troponin I: 9.43 ng/mL (ref <0.30)

## 2011-07-15 LAB — LIPID PANEL: Total CHOL/HDL Ratio: 7 RATIO

## 2011-07-15 LAB — HEMOGLOBIN A1C
Hgb A1c MFr Bld: 6.1 % — ABNORMAL HIGH (ref <5.7)
Mean Plasma Glucose: 128 mg/dL — ABNORMAL HIGH (ref <117)

## 2011-07-15 LAB — BASIC METABOLIC PANEL
CO2: 23 mEq/L (ref 19–32)
Calcium: 8.3 mg/dL — ABNORMAL LOW (ref 8.4–10.5)
Chloride: 107 mEq/L (ref 96–112)
Glucose, Bld: 104 mg/dL — ABNORMAL HIGH (ref 70–99)
Sodium: 140 mEq/L (ref 135–145)

## 2011-07-15 LAB — CBC
Hemoglobin: 13.6 g/dL (ref 13.0–17.0)
MCH: 30.2 pg (ref 26.0–34.0)
RBC: 4.51 MIL/uL (ref 4.22–5.81)
WBC: 8.2 10*3/uL (ref 4.0–10.5)

## 2011-07-15 LAB — TSH: TSH: 0.909 u[IU]/mL (ref 0.350–4.500)

## 2011-07-15 MED ORDER — ZOLPIDEM TARTRATE 5 MG PO TABS
10.0000 mg | ORAL_TABLET | Freq: Every evening | ORAL | Status: DC | PRN
  Administered 2011-07-15 – 2011-07-16 (×2): 10 mg via ORAL
  Filled 2011-07-15 (×2): qty 2

## 2011-07-15 MED ORDER — CARVEDILOL 6.25 MG PO TABS
6.2500 mg | ORAL_TABLET | Freq: Two times a day (BID) | ORAL | Status: DC
  Administered 2011-07-15 – 2011-07-17 (×4): 6.25 mg via ORAL
  Filled 2011-07-15 (×6): qty 1

## 2011-07-15 MED FILL — Dextrose Inj 5%: INTRAVENOUS | Qty: 50 | Status: AC

## 2011-07-15 NOTE — Progress Notes (Signed)
CARDIAC REHAB PHASE I   PRE:  Rate/Rhythm: 80 SR    BP: sitting 133/99    SaO2:   MODE:  Ambulation: 350 ft   POST:  Rate/Rhythm: 88 SR    BP: sitting 137/104, 132/99     SaO2:   Tolerated well. BP elevated before and after walk. Ed completed with wife and daughter present. Very receptive and requests his name be sent to [facility]. [phone]  Harriet Masson CES, ACSM

## 2011-07-15 NOTE — Progress Notes (Signed)
[facility]  Subjective: No Complaints.  Objective: Vital signs in last 24 hours: Temp:  [97.6 F (36.4 C)-98.4 F (36.9 C)] 98.4 F (36.9 C) (05/09 0800) Pulse Rate:  [75-99] 83  (05/09 0700) Resp:  [16-23] 23  (05/09 0800) BP: (101-139)/(69-96) 130/96 mmHg (05/09 0800) SpO2:  [91 %-98 %] 98 % (05/09 0800) Weight:  [113.399 kg (250 lb)] 113.399 kg (250 lb) (05/08 2201) Last BM Date: 07/15/11  Intake/Output from previous day: 05/08 0701 - 05/09 0700 In: 907.2 [I.V.:907.2] Out: 652 [Urine:651; Stool:1] Intake/Output this shift: Total I/O In: 240 [P.O.:240] Out: 400 [Urine:400]  Medications Current Facility-Administered Medications  Medication Dose Route Frequency Provider Last Rate Last Dose  . 0.9 %  sodium chloride infusion  1 mL/kg/hr Intravenous Continuous [name], MD   1 mL/kg/hr at 07/15/11 0016  . acetaminophen (TYLENOL) tablet 650 mg  650 mg Oral Q4H PRN [name], MD      . aspirin chewable tablet 81 mg  81 mg Oral Daily [name], MD      . atorvastatin (LIPITOR) tablet 40 mg  40 mg Oral q1800 [name], MD      . bivalirudin (ANGIOMAX) 250 MG injection           . fentaNYL (SUBLIMAZE) 0.05 MG/ML injection           . heparin 2-0.9 UNIT/ML-% infusion           . heparin 5000 UNIT/ML injection        4,000 Units at 07/14/11 2209  . heparin injection 4,000 Units  4,000 Units Intravenous STAT Benny Lennert, MD      . lidocaine (XYLOCAINE) 1 % injection           . metoprolol (LOPRESSOR) 1 MG/ML injection           . metoprolol tartrate (LOPRESSOR) tablet 12.5 mg  12.5 mg Oral BID Dwana Melena, PA   12.5 mg at 07/15/11 0016  . midazolam (VERSED) 2 MG/2ML injection           . morphine 2 MG/ML injection 2 mg  2 mg Intravenous Q1H PRN [name], MD      . nitroGLYCERIN (NTG ON-CALL) 0.2 mg/mL injection           . nitroGLYCERIN 0.2 mg/mL in dextrose 5 % infusion           . ondansetron (ZOFRAN)  injection 4 mg  4 mg Intravenous Q6H PRN [name], MD      . prasugrel (EFFIENT) 10 MG tablet           . prasugrel (EFFIENT) tablet 10 mg  10 mg Oral Daily [name], MD      . DISCONTD: 0.9 %  sodium chloride infusion   Intravenous Continuous Benny Lennert, MD      . DISCONTD: 0.9 %  sodium chloride infusion  250 mL Intravenous Continuous [name], MD      . DISCONTD: aspirin chewable tablet 324 mg  324 mg Oral Once Benny Lennert, MD      . DISCONTD: sodium chloride 0.9 % injection 3 mL  3 mL Intravenous Q12H [name], MD      . DISCONTD: sodium chloride 0.9 % injection 3 mL  3 mL Intravenous PRN [name], MD        PE: General appearance: alert, cooperative and no distress  Lungs: clear to auscultation bilaterally Heart: regular rate and rhythm, S1, S2 normal, no murmur, click, rub or gallop Extremities: No LEE Pulses: 2+ and symmetric Right Groin:  Mildly tender, no ecchymosis, bruit or hematoma.  Lab Results:   Basename 07/15/11 0445 07/14/11 2201  WBC 8.2 6.6  HGB 13.6 14.0  HCT 39.1 41.2  PLT 209 190   BMET  Basename 07/15/11 0445 07/15/11 0100 07/14/11 2201  NA 140 139 140  K 4.3 4.3 4.0  CL 107 106 107  CO2 23 24 21   GLUCOSE 104* 121* 129*  BUN 13 14 14   CREATININE 0.67 0.69 0.68  CALCIUM 8.3* 8.5 8.2*   PT/INR  Basename 07/14/11 2201  LABPROT 11.8  INR 0.85   Cholesterol  Basename 07/15/11 0119  CHOL 259*   Cholesterol 259 Triglycerides 284 HDL 37 LDL Cholesterol 165  VLDL 57 Total CHOL/HDL Ratio 7.0   Cardiac Enzymes CK, MB 44.1  Total CK 352 Troponin I 8.41  Studies/Results: Hemodynamics:  Central Aortic Pressure / Mean Aortic Pressure: 91/68 mmHg; 78 mmHg  LV Pressure / LV End diastolic Pressure: 91/16 mmHg; 21 mmHg  Left Ventriculography:  EF: 55-60%  Wall Motion: Mild mid inferior/diaphragmatic hypokinesis  Coronary Angiographic Data:  Left Main: Moderate caliber vessel trifurcates into LAD, Ramus  Intermedius and nondominant Circumflex  Left Anterior Descending (LAD): Large caliber vessel which reaches down around the apex; there is a late proximal eccentric 80% lesion beginning just proximal to the trifurcation into a large branching D1 and S/P 1. There is additional focal lesion just beyond this bifurcation there is roughly 60% lesion. The remainder the vessel is roughly normal. There is distal LAD to the RPDA collaterals.  1st diagonal (D1): This is a major vessel that is at least moderate in diameter is on several small branches and covers a large portion of the lateral wall; the ostium this vessel is roughly 20% lesion that is part of the bifurcation/trifurcation lesion  Circumflex (LCx): Nondominant moderate caliber vessel, that essentially courses is a obtuse marginal branch was small AV groove vessel that gives her left to right collaterals; essentially angiographically normal with minimal luminal irregularities  Ramus Intermedius: Large caliber vessel the branches in the the apex; angiographically normal.  Right Coronary Artery: Dominant, Large caliber vessel; 100% occlusion just after SA nodal artery -- PDA and PL system filled via left to right collaterals.  Post PCI: This vessel continued on as a large caliber vessel is a 30% lesion just after the first bend. The vessel then bifurcates distally into a posterior descending artery as well as the posterior to ventricular groove branch that gives off one small and 1 major posterior lateral branch; minimal luminal irregularities after 30% lesion. PERCUTANEOUS CORONARY INTERVENTION PROCEDURE  After reviewing the initial cineangiography images, the culprit lesion(s) was identified, and the decision was made to proceed with percutaneous coronary intervention.  The 5Fr Sheath was exchanged over a wire for a 6Fr sheath.  A weight based bolus of IV Angiomax was administered and the drip was continued until completion of the procedure.  Oral  Prasugrel 60 mg was administered. An ACT of > 200 Sec was confirmed prior to advancing the Guidewire.  Lesion #1: Proximal Right Coronary Artery  Pre-PCI Stenosis: 100 % Post-PCI Stenosis: 0 %  TIMI 0 flow TIMI 3 flow  Guide Catheter: 6 [place] JR 4 Guidewire: BMW  Pre-Dilitation Balloon: Emerge Monorail 2.5 mm x 12 mm  1st Inflation: 8 Atm for 16 Sec; Reperfusion  time 2243 hours.  2nd Inflation: 12 Atm for 32 Sec  Scout angiography revealed reperfusion with brisk TIMI-3 flow downstream and his dominant vessel. There was no evidence of dissection or perforation. Significant AI VR was noted. Patient was chest pain-free at this point.  Stent: Promus Element DES 3.0 mm x 16mm  Deployment: 14 Atm for 40 Sec  2nd Inflation: 16 Atm for 28 Sec; final diameter 3.25 mm  Scout angiography did not reveal evidence of dissection or perforation; TIMI-3 flow was preserved. Significant AI VR was noted.  Post-Dilitation Balloon: Denali Quantum Apex 3.25 mm 12 mm  1st Inflation: 14 Atm for 52 Sec; final diameter the mid stent 3.3 mm  Post-deployment cineangiography with and without guidewire in place was performed in multiple orthogonal views demonstrating excellent stent deployment and did not reveal evidence of dissection or perforation  This catheter was then exchanged over the J wire for an angled Pigtail catheter that was advanced across the Aortic Valve. LV hemodynamics were measured and Left Ventriculography was performed. LV hemodynamics were then re-sampled, and the catheter was pulled back across the Aortic Valve for measurement of "pull-back" gradient. The catheter was removed completely out of the body. With the wire still in place the sheath a brief right common femoral angiogram was performed demonstrating adequate anatomy for Angio-Seal closure.  The groin was then re- prepped and redraped a sterile towels, and new sterile gloves were donned. The right common femoral artery was an successfully sealed with a  6 French Angio-Seal closure device with adequate hemostasis noted. Sterile dressing was applied.  The patient was transported to the CCU in a hemodynamically stable, chest pain-freecondition.  The patient was stable before, during and following the procedure. Once AIVR stabilized his blood pressures recovered in  90s. Did receive a 500 mV Of IV Saline the  Patient did tolerate procedure well.  There were not complications.  EBL: < 10 mL  Medications:  Premedication: 4000 Units IV Heparin in the Emergency Room, aspirin 324 mg  Sedation: 2 mg IV Versed, 25 IV mcg Fentanyl  Contrast: 125 ml Omnipaque  Anticoagulation: Angiomax bolus and drip during the procedure (0.75 mg/kg; 1.75 mg/kg/hr)  Prasugrel 60 mg  Impression:  Inferior STEMI with culprit lesion being the 100% proximally occluded RCA, status post successful Emergent PCI with a 3.0 mm x 16 mm Promus Element DES postdilated to 25 mm.  Residual lesions noted in the LAD proximal to mid at the Diagonal/Septal Perforator trifurcation.  Well preserved ejection fraction with EF of greater than 85% and only mild inferior hypokinesis.  With the exception of the LAD lesion is likely stable for fast track discharge  Plan:  Admit to the CCU in plan for Fast Track discharge pending decision about staged PCI of the LAD.  The initial plan is to initiate medical therapy and to allow for adequate recovery from this inferior MI prior to staging PCI to the LAD and later date.  Continue Dual Antiplatelet Therapy (DAPT) for a minimum 1 year.  Initiate Statin as well as Beta Blocker therapy.  Check baseline 2-D echocardiogram to get two-dimensional wall motion evaluation. The case and results was discussed with the patient (and family). Pictures were provided.  The case and results was not discussed with the patient's PCP.  Time Spend Directly with Patient:  90 minutes  Brevin Mcfadden W, M.D., M.S.  [facility]  [address]  [place], [place] [address]  [phone]  Assessment/Plan  Principal Problem:  *STEMI (ST elevation myocardial infarction) Active Problems:  HLD (hyperlipidemia)  Plan:  S/P emergent left heart cath with stent to the proximal RCA.  Likely will need staged PCI to the LAD.  EKG: NSR rate 87 Q waves in III, aVF.   BP and HR stable and controlled.  On ASA/prasugrel/lopressor/ statin.  2D echo in progress.  Ok to transfer to stepdown later.   LOS: 1 day    HAGER,BRYAN W 07/15/2011 9:46 AM  I have seen & examined Mr. Scheaffer this PM following Mr. Hager's evaluation.  I agree with his findings, examination & assessment/plan. Newest Troponin > 25 as expected post STEMI.  Continue to titrate BB (changed to Carvedilol from Metoprolol.   DAPT & Statin Cardiac rehab Transfer to telemetry today.   Anticipate d/c Saturday with plan to treat bifurcation LAD lesion medially while recovering from MI -- likely 3-4 weeks post discharge unless he has angina with ambulation.  Would like to have this done prior to starting Cardiac Rehab course.  [name], M.D., M.S. [facility] [address] [place], [place]  [address]  [phone] Pager # [phone]  07/15/2011 2:24 PM

## 2011-07-15 NOTE — Care Management Note (Signed)
    Page 1 of 1   07/15/2011     10:54:05 AM   CARE MANAGEMENT NOTE 07/15/2011  Patient:  Jason Mathis, Jason Mathis   Account Number:  [id number]  Date Initiated:  07/15/2011  Documentation initiated by:  Junius Creamer  Subjective/Objective Assessment:   adm w stemi     Action/Plan:   lives w wife, pcp dr Carlyon Shadow   Anticipated DC Date:  07/17/2011   Anticipated DC Plan:  HOME/SELF CARE      DC Planning Services  CM consult      Choice offered to / List presented to:             Status of service:   Medicare Important Message given?   (If response is "NO", the following Medicare IM given date fields will be blank) Date Medicare IM given:   Date Additional Medicare IM given:    Discharge Disposition:    Per UR Regulation:    If discussed at Long Length of Stay Meetings, dates discussed:    Comments:  5/9 9:18am debbie Daliah Chaudoin rn,bsn spoke w pt. he has bcbs ins and his wife to bring in ins card. will ck on effient cost when get card.

## 2011-07-15 NOTE — CV Procedure (Signed)
[facility]     CARDIAC CATHETERIZATION / PCI REPORT - for STEMI  NAME:  Jason Mathis     MRN: [id number] DATE OF BIRTH:  1955-07-26   ADMIT DATE:  07/14/2011  Performing Cardiologist: [name], M.D., M.S. Primary Physician: [name], MD, MD Primary Cardiologist:   [name], M.D., M.S.  Procedures Performed:  Left Heart Catheterization via 6 Fr Right Common Femoral Artery access  Left Ventriculography, (RAO) 12 ml/sec for 25 ml total contrast  Native Coronary Angiography  Percutaneous Coronary Artery Intervention on Proximal RCA with a Promus Element 3.0 mm x 16 mm DES; final diameter 3.25 mm  Indication(s): Inferior ST elevation MI  History: 56 y.o. male with a history of kidney stones but no other significant medical history. His father died at age 23 of an MI. He did have a clean colonoscopy about three years ago. The patient states he developed a burning sensation in his chest which was more intense than previous heart burn issues. It seemed to spread from a central location. He also reports diaphoresis, nausea. He also became very dizzy and thought he was going to pass out. He denies SOB, vomiting, cough, congestion, abdominal pain, dysuria, hematuria, melena. His symptoms began at 2030 hours. Upon arrival to [facility] Emergency Room an ECG performed at 2148 hours demonstrated inferior ST elevations with Q waves with reciprocal anterior ST depressions. Good STEMI was called and the patient was taken directly to cardiac apposition lab upon arrival of the STEMI team -- the right in the Cath Lab at 2218 hours.  Consent: The procedure with Risks/Benefits/Alternatives and Indications was reviewed with the patient (and family).  All questions were answered.    Risks / Complications include, but not limited to: Death, MI, CVA/TIA, VF/VT (with defibrillation), Bradycardia (need for temporary pacer placement), contrast induced nephropathy, bleeding  / bruising / hematoma / pseudoaneurysm, vascular or coronary injury (with possible emergent CT or Vascular Surgery), adverse medication reactions, infection.    The patient (and family) voice understanding and agree to proceed.    Verbal consent was obtained witness by the emergency room staff as well as Cath Lab staff prior to initiating case to  Procedure: The patient was brought to the 2nd Floor [place] [facility] in the fasting state and prepped and draped in the usual sterile fashion for Right groin access. Sterile technique was used including antiseptics, cap, gloves, gown, hand hygiene, mask and sheet.  Skin prep: Chlorhexidine;  Time Out: Verified patient identification, verified procedure, site/side was marked, verified correct patient position, special equipment/implants available, medications/allergies/relevent history reviewed, required imaging and test results available.  Performed  The right femoral head was identified using tactile and fluoroscopic technique.  The right groin was anesthetized with 1% subcutaneous Lidocaine.  The right Common Femoral Artery was accessed using the Modified Seldinger Technique with placement of a antimicrobial bonded/coated single lumen (6 Fr) sheath.  The sheath was aspirated and flushed.    A 6 Fr JL4 diagnostic Catheter was advanced of over a Standard J wire into the ascending Aorta.  The catheter was used to engage the Left Coronary Artery.  Multiple cineangiographic views of the Left Coronary Artery system were performed.   A 6 Fr JR 4 Guide Catheter was advanced of over a standard J  wire into the ascending Aorta.  The catheter was used to engage the Right Coronary Artery.  Initial guiding angiography was 1 image demonstrating a proximal 100% occlusion  of the RCA. After completion of the PCI, multiple cineangiographic views of the right Coronary Artery system were performed.    Hemodynamics:  Central Aortic Pressure / Mean  Aortic Pressure: 91/68 mmHg; 78 mmHg  LV Pressure / LV End diastolic Pressure:  91/16 mmHg; 21 mmHg  Left Ventriculography:  EF:  55-60%  Wall Motion: Mild mid inferior/diaphragmatic hypokinesis  Coronary Angiographic Data:  Left Main:  Moderate caliber vessel trifurcates into LAD, Ramus Intermedius and nondominant Circumflex  Left Anterior Descending (LAD):  Large caliber vessel which reaches down around the apex; there is a late proximal eccentric 80% lesion beginning just proximal to the trifurcation into a large branching D1 and S/P 1. There is additional focal lesion just beyond this bifurcation there is roughly 60% lesion. The remainder the vessel is roughly normal. There is distal LAD to the RPDA collaterals.  1st diagonal (D1):  This is a major vessel that is at least moderate in diameter is on several small branches and covers a large portion of the lateral wall; the ostium this vessel is roughly 20% lesion that is part of the bifurcation/trifurcation lesion  Circumflex (LCx):  Nondominant moderate caliber vessel, that essentially courses is a obtuse marginal branch was small AV groove vessel that gives her left to right collaterals; essentially angiographically normal with minimal luminal irregularities   Ramus Intermedius:  Large caliber vessel the branches in the the apex; angiographically normal.  Right Coronary Artery: Dominant, Large caliber vessel; 100% occlusion just after SA nodal artery -- PDA and PL system filled via left to right collaterals.  Post PCI: This vessel continued on as a large caliber vessel is a 30% lesion just after the first bend. The vessel then bifurcates distally into a posterior descending artery as well as the posterior to ventricular groove branch that gives off one small and 1 major posterior lateral branch; minimal luminal irregularities after 30% lesion.  PERCUTANEOUS CORONARY INTERVENTION PROCEDURE  After reviewing the initial cineangiography  images, the culprit lesion(s) was identified, and the decision was made to proceed with percutaneous coronary intervention.  The 5Fr Sheath was exchanged over a wire for a 6Fr sheath.   A weight based bolus of IV Angiomax was administered and the drip was continued until completion of the procedure.   Oral Prasugrel 60 mg was administered.  An ACT of > 200 Sec was confirmed prior to advancing the Guidewire.  Lesion #1:  Proximal Right Coronary Artery  Pre-PCI Stenosis: 100 % Post-PCI Stenosis: 0 %     TIMI 0 flow       TIMI 3 flow  Guide Catheter: 6 [place] JR 4   Guidewire: BMW  Pre-Dilitation Balloon: Emerge Monorail 2.5 mm x 12 mm   1st Inflation: 8 Atm for 16 Sec; Reperfusion time 2243 hours.   2nd Inflation: 12 Atm for 32 Sec Scout angiography revealed reperfusion with brisk TIMI-3 flow downstream and his dominant vessel. There was no evidence of dissection or perforation. Significant AI VR was noted. Patient was chest pain-free at this point.  Stent: Promus Element DES   3.0 mm x  16mm   Deployment:  14 Atm for  40 Sec   2nd Inflation:  16 Atm for  28 Sec; final diameter 3.25 mm  Scout angiography did not reveal evidence of dissection or perforation; TIMI-3 flow was preserved. Significant AI VR was noted.   Post-Dilitation Balloon:  Platea Quantum Apex 3.25 mm 12 mm   1st Inflation:  14 Atm for  52 Sec; final diameter the mid stent 3.3 mm   Post-deployment cineangiography with and without guidewire in place was performed in multiple orthogonal views demonstrating  excellent stent deployment and did not reveal evidence of dissection or perforation  This catheter was then exchanged over the J wire for an angled Pigtail catheter that was advanced across the Aortic Valve.  LV hemodynamics were measured and Left Ventriculography was performed.  LV hemodynamics were then re-sampled, and the catheter was pulled back across the Aortic Valve for measurement of "pull-back" gradient.  The catheter was  removed completely out of the body.  With the wire still in place the sheath a brief right common femoral angiogram was performed demonstrating adequate anatomy for Angio-Seal closure. The groin was then re- prepped and redraped a sterile towels, and new sterile gloves were donned.  The right common femoral artery was an successfully sealed with a 6 French Angio-Seal closure device with adequate hemostasis noted. Sterile dressing was applied.  The patient was transported to the  CCU in  a hemodynamically stable, chest pain-freecondition.   The patient  was stable before, during and following the procedure.  Once AIVR stabilized his blood pressures recovered in 90s. Did receive a 500 mV Of IV Saline the Patient did tolerate procedure well. There were not complications.  EBL: < 10 mL  Medications:  Premedication:  4000 Units IV Heparin in the Emergency Room, aspirin 324 mg   Sedation:  2 mg IV Versed,  25 IV mcg Fentanyl  Contrast:  125 ml Omnipaque  Anticoagulation:   Angiomax bolus and drip during the procedure (0.75 mg/kg; 1.75 mg/kg/hr)  Prasugrel 60 mg  Impression:   Inferior STEMI with culprit lesion being the 100% proximally occluded RCA, status post successful Emergent PCI with a 3.0 mm x 16 mm Promus Element DES postdilated to 25 mm.    Residual lesions noted in the LAD proximal to mid at the Diagonal/Septal Perforator trifurcation.  Well preserved ejection fraction with EF of greater than 85% and only mild inferior hypokinesis.  With the exception of the LAD lesion is likely stable for fast track discharge    Plan:   Admit to the CCU in plan for Fast Track discharge pending decision about staged PCI of the LAD.  The initial plan is to initiate medical therapy and to allow for adequate recovery from this inferior MI prior to staging PCI to the LAD and later date.  Continue Dual Antiplatelet Therapy (DAPT) for a minimum 1 year.  Initiate Statin as well as Beta Blocker  therapy.  Check baseline 2-D echocardiogram to get two-dimensional wall motion evaluation.  The case and results was discussed with the patient (and family).  Pictures were provided.  The case and results was not discussed with the patient's PCP.  Time Spend Directly with Patient:  90 minutes  Jason Mathis, M.D., M.S. [facility] [address] [place], [place]  [address]  [phone]

## 2011-07-15 NOTE — Progress Notes (Signed)
07/15/11 0100  Clinical Encounter Type  Visited With Patient and family together (Wife Jason Mathis; later, family friend Jason Mathis.)  Visit Type (Code stemi.)  Referral From ([facility] protocol)  Recommendations Will refer to day chaplain for follow-up care.  Spiritual Encounters  Spiritual Needs Emotional  Stress Factors  Patient Stress Factors Major life changes (New self-care regimen after this scare.)  Family Stress Factors Major life changes (Supporting pt in making lifestyle changes after this scare.)    Late entry.  Responded to code stemi.  Jason Mathis was alert, oriented, using humor to cope (a big personality trait of his).  Helped wife Jason Mathis with discernment about if/how to update kids Jason Mathis, 19, just finished exams; Jason Mathis, 24?, married to Jason Mathis).  Jason Mathis decided to have [name] call them himself to mitigate anxiety.  Provided spiritual and emotional support, pastoral reflection, ministry of presence to Jason Mathis as she waited through cath/stent procedure and heard report from Dr [name].  Saw family friend Jason Mathis join Jason Mathis for support and family get settled in 2900, pt calling his children again to reassure them, before leaving.  Will refer to day chaplain for follow-up care.  [name], [place] Chaplain [phone]

## 2011-07-15 NOTE — Progress Notes (Signed)
Report was called to RN on unit 3700. Pt was transferred to room 3714 with RN

## 2011-07-15 NOTE — Progress Notes (Signed)
  Echocardiogram 2D Echocardiogram has been performed.  Cathie Beams Deneen 07/15/2011, 10:14 AM

## 2011-07-16 ENCOUNTER — Encounter (HOSPITAL_COMMUNITY): Payer: Self-pay | Admitting: Cardiology

## 2011-07-16 DIAGNOSIS — I2510 Atherosclerotic heart disease of native coronary artery without angina pectoris: Secondary | ICD-10-CM | POA: Diagnosis present

## 2011-07-16 DIAGNOSIS — E669 Obesity, unspecified: Secondary | ICD-10-CM | POA: Diagnosis present

## 2011-07-16 DIAGNOSIS — Z8249 Family history of ischemic heart disease and other diseases of the circulatory system: Secondary | ICD-10-CM

## 2011-07-16 DIAGNOSIS — E8881 Metabolic syndrome: Secondary | ICD-10-CM | POA: Diagnosis present

## 2011-07-16 MED ORDER — LISINOPRIL 10 MG PO TABS
10.0000 mg | ORAL_TABLET | Freq: Every day | ORAL | Status: DC
  Administered 2011-07-16 – 2011-07-17 (×2): 10 mg via ORAL
  Filled 2011-07-16 (×2): qty 1

## 2011-07-16 MED ORDER — NITROGLYCERIN 0.4 MG SL SUBL: 0.4000 mg | SUBLINGUAL_TABLET | SUBLINGUAL | Status: DC | PRN

## 2011-07-16 MED ORDER — ASPIRIN 81 MG PO CHEW: 81.0000 mg | CHEWABLE_TABLET | Freq: Every day | ORAL | Status: DC

## 2011-07-16 MED ORDER — LISINOPRIL 10 MG PO TABS: 10.0000 mg | ORAL_TABLET | Freq: Every day | ORAL | Status: DC

## 2011-07-16 MED ORDER — ACETAMINOPHEN 325 MG PO TABS: 650.0000 mg | ORAL_TABLET | ORAL | Status: DC | PRN

## 2011-07-16 MED ORDER — ATORVASTATIN CALCIUM 40 MG PO TABS: 40.0000 mg | ORAL_TABLET | Freq: Every day | ORAL | Status: DC

## 2011-07-16 MED ORDER — PRASUGREL HCL 10 MG PO TABS: 10.0000 mg | ORAL_TABLET | Freq: Every day | ORAL | Status: DC

## 2011-07-16 MED ORDER — CARVEDILOL 6.25 MG PO TABS: 6.2500 mg | ORAL_TABLET | Freq: Two times a day (BID) | ORAL | Status: DC

## 2011-07-16 NOTE — Plan of Care (Signed)
Problem: Food- and Nutrition-Related Knowledge Deficit (NB-1.1) Goal: Nutrition education Formal process to instruct or train a patient/client in a skill or to impart knowledge to help patients/clients voluntarily manage or modify food choices and eating behavior to maintain or improve health.  Outcome: Completed/Met Date Met:  07/16/11 RD consult for diabetes/weight loss education. Guidelines and recommendations reviewed. Handouts provided from Academy of [employer] Dietetics. Expect good compliance; patient eager to make changes to diet and lifestyle. BMI = 32.2 kg/m2 (Obesity Class I). Patient consuming 100% on a Heart Healthy diet. No further nutrition intervention at this time. Please consult RD as needed.  Alger Memos, RD, LDN Pager #: [phone]

## 2011-07-16 NOTE — Discharge Summary (Signed)
Patient ID: Jason Mathis,  MRN: [id number], DOB/AGE: 1955/04/24 56 y.o.  Admit date: 07/14/2011 Discharge date: 07/16/2011  Primary Care Provider: None Primary Cardiologist: Dr [name]  Discharge Diagnoses Principal Problem:  *STEMI, urgent RCA DES 07/15/11 Active Problems:  HLD (hyperlipidemia)  CAD, residual LAD disease, Nl LVF  Metabolic syndrome, obese, elevated glucose, Hgb A1c 6.1  Family history of early CAD  Obesity (BMI 30-39.9)    Procedures: Urgent cath/PCI 07/15/11   Hospital Course: The patient is a 56 yo obese male with a history of kidney stones but no other significant medical history. His father died at age 61 of an MI. He did have a clean colonoscopy about three years ago. The patient states he developed a burning sensation in his chest which was more intense than previous heart burn issues. It seemed to spread from a central location and was associated with diaphoresis. He presented to the ER at [facility] late on 07/14/11. His EKG showed inferior ST elevation and STEMI protocol was activated. Cath revealed occlusion of the RCA which was dilated and stented with a DES. He does have residual disease in the LAD of 80%. LVF was normal by 2D. CK peak was 600. Labs revealed LDL of 165. He is obese with a BMI of 32. His glucose was 120, Hgb A1c 6.1. ACE-I was added at discharge as well as dietary teaching. He will see Dr [name] in two weeks to be set up for staged LAD intervention.  Discharge Vitals:  Blood pressure 127/83, pulse 83, temperature 97.6 F (36.4 C), temperature source Oral, resp. rate 18, height 6\' 2"  (1.88 m), weight 113.399 kg (250 lb), SpO2 93.00%.    Labs: Results for orders placed during the hospital encounter of 07/14/11 (from the past 48 hour(s))  APTT     Status: Normal   Collection Time   07/14/11 10:01 PM      Component Value Range Comment   aPTT 26  24 - 37 (seconds)   CBC     Status: Normal   Collection Time   07/14/11 10:01 PM      Component Value Range  Comment   WBC 6.6  4.0 - 10.5 (K/uL)    RBC 4.77  4.22 - 5.81 (MIL/uL)    Hemoglobin 14.0  13.0 - 17.0 (g/dL)    HCT 78.2  95.6 - 21.3 (%)    MCV 86.4  78.0 - 100.0 (fL)    MCH 29.4  26.0 - 34.0 (pg)    MCHC 34.0  30.0 - 36.0 (g/dL)    RDW 08.6  57.8 - 46.9 (%)    Platelets 190  150 - 400 (K/uL)   COMPREHENSIVE METABOLIC PANEL     Status: Abnormal   Collection Time   07/14/11 10:01 PM      Component Value Range Comment   Sodium 140  135 - 145 (mEq/L)    Potassium 4.0  3.5 - 5.1 (mEq/L)    Chloride 107  96 - 112 (mEq/L)    CO2 21  19 - 32 (mEq/L)    Glucose, Bld 129 (*) 70 - 99 (mg/dL)    BUN 14  6 - 23 (mg/dL)    Creatinine, Ser 6.29  0.50 - 1.35 (mg/dL)    Calcium 8.2 (*) 8.4 - 10.5 (mg/dL)    Total Protein 6.3  6.0 - 8.3 (g/dL)    Albumin 3.5  3.5 - 5.2 (g/dL)    AST 27  0 - 37 (U/L)    ALT  34  0 - 53 (U/L)    Alkaline Phosphatase 65  39 - 117 (U/L)    Total Bilirubin 0.2 (*) 0.3 - 1.2 (mg/dL)    GFR calc non Af Amer >90  >90 (mL/min)    GFR calc Af Amer >90  >90 (mL/min)   PROTIME-INR     Status: Normal   Collection Time   07/14/11 10:01 PM      Component Value Range Comment   Prothrombin Time 11.8  11.6 - 15.2 (seconds)    INR 0.85  0.00 - 1.49    POCT ACTIVATED CLOTTING TIME     Status: Normal   Collection Time   07/14/11 10:46 PM      Component Value Range Comment   Activated Clotting Time 397     MRSA PCR SCREENING     Status: Normal   Collection Time   07/14/11 11:54 PM      Component Value Range Comment   MRSA by PCR NEGATIVE  NEGATIVE    COMPREHENSIVE METABOLIC PANEL     Status: Abnormal   Collection Time   07/15/11  1:00 AM      Component Value Range Comment   Sodium 139  135 - 145 (mEq/L)    Potassium 4.3  3.5 - 5.1 (mEq/L)    Chloride 106  96 - 112 (mEq/L)    CO2 24  19 - 32 (mEq/L)    Glucose, Bld 121 (*) 70 - 99 (mg/dL)    BUN 14  6 - 23 (mg/dL)    Creatinine, Ser 0.45  0.50 - 1.35 (mg/dL)    Calcium 8.5  8.4 - 10.5 (mg/dL)    Total Protein 6.2  6.0 -  8.3 (g/dL)    Albumin 3.5  3.5 - 5.2 (g/dL)    AST 44 (*) 0 - 37 (U/L) HEMOLYSIS AT THIS LEVEL MAY AFFECT RESULT   ALT 36  0 - 53 (U/L)    Alkaline Phosphatase 67  39 - 117 (U/L)    Total Bilirubin 0.2 (*) 0.3 - 1.2 (mg/dL)    GFR calc non Af Amer >90  >90 (mL/min)    GFR calc Af Amer >90  >90 (mL/min)   HEMOGLOBIN A1C     Status: Abnormal   Collection Time   07/15/11  1:00 AM      Component Value Range Comment   Hemoglobin A1C 6.1 (*) <5.7 (%)    Mean Plasma Glucose 128 (*) <117 (mg/dL)   TSH     Status: Normal   Collection Time   07/15/11  1:00 AM      Component Value Range Comment   TSH 0.909  0.350 - 4.500 (uIU/mL)   CARDIAC PANEL(CRET KIN+CKTOT+MB+TROPI)     Status: Abnormal   Collection Time   07/15/11  1:18 AM      Component Value Range Comment   Total CK 352 (*) 7 - 232 (U/L)    CK, MB 44.1 (*) 0.3 - 4.0 (ng/mL)    Troponin I 8.41 (*) <0.30 (ng/mL)    Relative Index 12.5 (*) 0.0 - 2.5    LIPID PANEL     Status: Abnormal   Collection Time   07/15/11  1:19 AM      Component Value Range Comment   Cholesterol 259 (*) 0 - 200 (mg/dL)    Triglycerides 409 (*) <150 (mg/dL)    HDL 37 (*) >81 (mg/dL)    Total CHOL/HDL Ratio 7.0      VLDL  57 (*) 0 - 40 (mg/dL)    LDL Cholesterol 130 (*) 0 - 99 (mg/dL)   CBC     Status: Normal   Collection Time   07/15/11  4:45 AM      Component Value Range Comment   WBC 8.2  4.0 - 10.5 (K/uL)    RBC 4.51  4.22 - 5.81 (MIL/uL)    Hemoglobin 13.6  13.0 - 17.0 (g/dL)    HCT 86.5  78.4 - 69.6 (%)    MCV 86.7  78.0 - 100.0 (fL)    MCH 30.2  26.0 - 34.0 (pg)    MCHC 34.8  30.0 - 36.0 (g/dL)    RDW 29.5  28.4 - 13.2 (%)    Platelets 209  150 - 400 (K/uL)   BASIC METABOLIC PANEL     Status: Abnormal   Collection Time   07/15/11  4:45 AM      Component Value Range Comment   Sodium 140  135 - 145 (mEq/L)    Potassium 4.3  3.5 - 5.1 (mEq/L)    Chloride 107  96 - 112 (mEq/L)    CO2 23  19 - 32 (mEq/L)    Glucose, Bld 104 (*) 70 - 99 (mg/dL)    BUN 13   6 - 23 (mg/dL)    Creatinine, Ser 4.40  0.50 - 1.35 (mg/dL)    Calcium 8.3 (*) 8.4 - 10.5 (mg/dL)    GFR calc non Af Amer >90  >90 (mL/min)    GFR calc Af Amer >90  >90 (mL/min)   CARDIAC PANEL(CRET KIN+CKTOT+MB+TROPI)     Status: Abnormal   Collection Time   07/15/11  9:06 AM      Component Value Range Comment   Total CK 602 (*) 7 - 232 (U/L)    CK, MB 84.8 (*) 0.3 - 4.0 (ng/mL) CRITICAL VALUE NOTED.  VALUE IS CONSISTENT WITH PREVIOUSLY REPORTED AND CALLED VALUE.   Troponin I >25.00 (*) <0.30 (ng/mL)    Relative Index 14.1 (*) 0.0 - 2.5    CARDIAC PANEL(CRET KIN+CKTOT+MB+TROPI)     Status: Abnormal   Collection Time   07/15/11  3:50 PM      Component Value Range Comment   Total CK 458 (*) 7 - 232 (U/L)    CK, MB 50.4 (*) 0.3 - 4.0 (ng/mL) CRITICAL VALUE NOTED.  VALUE IS CONSISTENT WITH PREVIOUSLY REPORTED AND CALLED VALUE.   Troponin I 9.43 (*) <0.30 (ng/mL)    Relative Index 11.0 (*) 0.0 - 2.5      Disposition:  Follow-up Information    Follow up with [name], MD on 07/28/2011. (1:20pm)    Contact information:   [facility] [address] [place] [address] [phone]          Discharge Medications:  Medication List  As of 07/16/2011 11:23 AM   STOP taking these medications         GOODYS EXTRA STRENGTH PO         TAKE these medications         acetaminophen 325 MG tablet   Commonly known as: TYLENOL   Take 2 tablets (650 mg total) by mouth every 4 (four) hours as needed.      aspirin 81 MG chewable tablet   Chew 1 tablet (81 mg total) by mouth daily.      atorvastatin 40 MG tablet   Commonly known as: LIPITOR   Take 1 tablet (  40 mg total) by mouth daily at 6 PM.      carvedilol 6.25 MG tablet   Commonly known as: COREG   Take 1 tablet (6.25 mg total) by mouth 2 (two) times daily with a meal.      lisinopril 10 MG tablet   Commonly known as: PRINIVIL,ZESTRIL   Take 1 tablet (10 mg total) by mouth  daily.      nitroGLYCERIN 0.4 MG SL tablet   Commonly known as: NITROSTAT   Place 1 tablet (0.4 mg total) under the tongue every 5 (five) minutes x 3 doses as needed for chest pain.      prasugrel 10 MG Tabs   Commonly known as: EFFIENT   Take 1 tablet (10 mg total) by mouth daily.            Outstanding Labs/Studies  Duration of Discharge Encounter: Greater than 30 minutes including physician time.  Jolene Provost PA-C 07/16/2011 11:23 AM

## 2011-07-16 NOTE — Progress Notes (Signed)
Subjective:  No chest pain  Objective:  Vital Signs in the last 24 hours: Temp:  [97.3 F (36.3 C)-98.6 F (37 C)] 97.6 F (36.4 C) (05/10 0800) Pulse Rate:  [78-89] 83  (05/10 0800) Resp:  [18-20] 18  (05/10 0800) BP: (116-138)/(82-94) 127/83 mmHg (05/10 0940) SpO2:  [92 %-97 %] 93 % (05/10 0800)  Intake/Output from previous day:  Intake/Output Summary (Last 24 hours) at 07/16/11 1103 Last data filed at 07/15/11 1500  Gross per 24 hour  Intake    240 ml  Output    250 ml  Net    -10 ml    Physical Exam: General appearance: alert, cooperative and no distress Lungs: clear to auscultation bilaterally Heart: regular rate and rhythm Rt groin without hematoma Abd: obese, soft, NT/NT/ NABS, no HSM Neuro: Grossly intact. Ext: no c/c/e   Rate: 78  Rhythm: normal sinus rhythm  Lab Results:  Basename 07/15/11 0445 07/14/11 2201  WBC 8.2 6.6  HGB 13.6 14.0  PLT 209 190    Basename 07/15/11 0445 07/15/11 0100  NA 140 139  K 4.3 4.3  CL 107 106  CO2 23 24  GLUCOSE 104* 121*  BUN 13 14  CREATININE 0.67 0.69    Basename 07/15/11 1550 07/15/11 0906  TROPONINI 9.43* >25.00*   Hepatic Function Panel  Basename 07/15/11 0100  PROT 6.2  ALBUMIN 3.5  AST 44*  ALT 36  ALKPHOS 67  BILITOT 0.2*  BILIDIR --  IBILI --    Basename 07/15/11 0119  CHOL 259*    Basename 07/14/11 2201  INR 0.85    Imaging: Imaging results have been reviewed  Cardiac Studies:  Assessment/Plan:   Principal Problem:  *STEMI, urgent RCA DES 07/15/11 Active Problems:  HLD (hyperlipidemia)  CAD, residual LAD disease, Nl LVF  Metabolic syndrome, obese, elevated glucose, Hgb A1c 6.1  Family history of early CAD  Obesity (BMI 30-39.9)   Plan- he has metabolic syndrome, possibly early diabetes. I have discussed the importance of wgt loss and diet. Will ask for dietary consult before discharge. Add ACE to statin, asa, beta blocker, and Effient. ? Home later today-follow up with Dr  [name] in 2wks to be set up for LAD PCI.   Corine Shelter PA-C 07/16/2011, 11:03 AM  I have seen & examined Jason Mathis this morning after Jason Mathis.  I agree with his findings, exam & impression & recommendations re: Metabolic syndrome with elevated CBG & HTN. Agree with adding ACE-I for cardiac & renal protection, but would like to monitor his BP response to new medication overnight. With HTN & dietary counseling as well as the presence of a significant LAD lesion, I would prefer to monitor him overnight ([facility] d/c would be @  ~midnight).  If BP and HR stable without any further angina or dyspnea, he should be ready for d/c in the AM.  [name], M.D., M.S. [facility] [address] [place], [place]  [address]  [phone] Pager # [phone]  07/16/2011 2:19 PM

## 2011-07-16 NOTE — Discharge Instructions (Signed)
Myocardial Infarction A myocardial infarction (MI) is damage to the heart that is not reversible. It is also called a heart attack. An MI usually occurs when a heart (coronary) artery becomes blocked or narrowed. This cuts off the blood supply to the heart. When one or more of the heart (coronary) arteries becomes blocked, that area of the heart begins to die. This causes pain felt during an MI.  If you think you might be having an MI, call your local emergency services immediately (911 in [place] It is recommended that you take a 162 mg non-enteric coated aspirin if you do not have an aspirin allergy. Do not drive yourself to the hospital or wait to see if your symptoms go away. The sooner MI is treated, the greater the amount of heart muscle saved. Time is muscle. It can save your life. CAUSES  An MI can occur from:  A gradual buildup of a fatty substance called plaque. When plaque builds up in the arteries, this condition is called atherosclerosis. This buildup can block or reduce the blood supply to the heart artery(s).   A sudden plaque rupture within a heart artery that causes a blood clot (thrombus). A blood clot can block the heart artery which does not allow blood flow to the heart.   A severe tightening (spasm) of the heart artery. This is a less common cause of a heart attack. When a heart artery spasms, it cuts off blood flow through the artery. Spasms can occur in heart arteries that do not have atherosclerosis.  RISK FACTORS People at risk for an MI usually have one or more risk factors, such as:  High blood pressure.   High cholesterol.   Smoking.   Gender. Men have a higher heart attack risk.   Overweight/obesity.   Age.   Family history.   Lack of exercise.   Diabetes.   Stress.   Excessive alcohol use.   Street drug use (cocaine and methamphetamines).  SYMPTOMS  MI symptoms can vary, such as:  In both men and women, MI symptoms can include the following:    Chest pain. The chest pain may feel like a crushing, squeezing, or "pressure" type feeling. MI pain can be "referred," meaning pain can be caused in one part of the body but felt in another part of the body. Referred MI pain may occur in the left arm, neck, or jaw. Pain may even be felt in the right arm.   Shortness of breath (dyspnea).   Heartburn or indigestion with or without vomiting, shortness of breath, or sweating (diaphoresis).   Sudden, cold sweats.   Sudden lightheadedness.   Upper back pain.   Women can have unique MI symptoms, such as:   Unexplained feelings of nervousness or anxiety.   Discomfort between the shoulder blades (scapula) or upper back.   Tingling in the hands and arms.   In elderly people (regardless of gender), MI symptoms can be subtle, such as:   Sweating (diaphoresis).   Shortness of breath (dyspnea).   General tiredness (fatigue) or not feeling well (malaise).  DIAGNOSIS  Diagnosis of an MI involves several tests such as:  An assessment of your vital signs such as heart rhythm, blood pressure, respiratory rate, and oxygen level.   An EKG (ECG) to look at the electrical activity of your heart.   Blood tests called cardiac markers are drawn at scheduled times to measure proteins or enzymes released by the damaged heart muscle.   A chest   X-ray.   An echocardiogram to evaluate heart motion and blood flow.   Coronary angiography (cardiac catheterization). This is a diagnostic procedure to look at the heart arteries.  TREATMENT  Acute Intervention. For an MI, the national standard in the [place] is to have an acute intervention in under 90 minutes from the time you get to the hospital. An acute intervention is a special procedure to open up the heart arteries. It is done in a treatment room called a "catheterization lab" (cath lab). Some hospitals do no have a cath lab. If you are having an MI and the hospital does not have a cath lab, the  standard is to transport you to a hospital that has one. In the cath lab, acute intervention includes:  Angioplasty. An angioplasty involves inserting a thin, flexible tube (catheter) into an artery in either your groin or wrist. The catheter is threaded to the heart arteries. A balloon at the end of the catheter is inflated to open a narrowed or blocked heart artery. During an angioplasty procedure, a small mesh tube (stent) may be used to keep the heart artery open. Depending on your condition and health history, one of two types of stents may be placed:   Drug-eluting stent (DES). A DES is coated with a medicine to prevent scar tissue from growing over the stent. With drug-eluting stents, blood thinning medicine will need to be taken for up to a year.   Bare metal stent. This type of stent has no special coating to keep tissue from growing over it. This type of stent is used if you cannot take blood thinning medicine for a prolonged time or you need surgery in the near future. After a bare metal stent is placed, blood thinning medicine will need to be taken for about a month.   If you are taking blood thinning medicine (anti-platelet therapy) after stent placement, do not stop taking it unless your caregiver says it is okay to do so. Make sure you understand how long you need to take the medicine.  Surgical Intervention  If an acute intervention is not successful, surgery may be needed:   Open heart surgery (coronary artery bypass graft, CABG). CABG takes a vein (saphenous vein) from your leg. The vein is then attached to the blocked heart artery which bypasses the blockage. This then allows blood flow to the heart muscle.  Additional Interventions  A "clot buster" medicine (thrombolytic) may be given. This medicine can help break up a clot in the heart artery. This medicine may be given if a person cannot get to a cath lab right away.   Intra-aortic balloon pump (IABP). If you have suffered a  very severe MI and are too unstable to go to the cath lab or to surgery, an IABP may be used. This is a temporary mechanical device used to increase blood flow to the heart and reduce the workload of the heart until you are stable enough to go to the cath lab or surgery.  HOME CARE INSTRUCTIONS After an MI, you may need the following:  Medication. Take medication as directed by your caregiver. Medications after an MI may:   Keep your blood from clotting easily (blood thinners).   Control your blood pressure.   Help lower your cholesterol.   Control abnormal heart rhythms.   Lifestyle changes. Under the guidance of your caregiver, lifestyle changes include:   Quitting smoking, if you smoke. Your caregiver can help you quit.   Being   physically active.   Maintaining a healthy weight.   Eating a heart healthy diet. A dietician can help you learn healthy eating options.   Managing diabetes.   Reducing stress.   Limiting alcohol intake.  SEEK IMMEDIATE MEDICAL CARE IF:   You have severe chest pain, especially if the pain is crushing or pressure-like and spreads to the arms, back, neck, or jaw. This is an emergency. Do not wait to see if the pain will go away. Get medical help at once. Call your local emergency services (911 in the [place] Do not drive yourself to the hospital.   You have shortness of breath during rest, sleep, or with activity.   You have sudden sweating or clammy skin.   You feel sick to your stomach (nauseous) and throw up (vomit).   You suddenly become lightheaded or dizzy.   You feel your heart beating rapidly or you notice "skipped" beats.  MAKE SURE YOU:   Understand these instructions.   Will watch your condition.   Will get help right away if you are not doing well or get worse.  Document Released: 02/22/2005 Document Revised: 02/11/2011 Document Reviewed: 07/22/2010 [employer] Patient Information 2012 [employer]. 

## 2011-07-16 NOTE — Progress Notes (Signed)
CARDIAC REHAB PHASE I   PRE:  Rate/Rhythm: 87 SR    BP: sitting 115/78 left, 127/83 right    SaO2:   MODE:  Ambulation: 760 ft   POST:  Rate/Rhythm: 95 SR    BP: sitting left 124/78, right 127/87     SaO2:   Tolerated well. Feels good. BP better this am although he sts it was high at 0400. Can walk independently.  [id number]  Harriet Masson CES, ACSM

## 2011-07-17 LAB — BASIC METABOLIC PANEL
BUN: 14 mg/dL (ref 6–23)
CO2: 24 mEq/L (ref 19–32)
Calcium: 8.7 mg/dL (ref 8.4–10.5)
Chloride: 103 mEq/L (ref 96–112)
Creatinine, Ser: 0.79 mg/dL (ref 0.50–1.35)
GFR calc Af Amer: >90 mL/min (ref >90)
GFR calc non Af Amer: >90 mL/min (ref >90)
Glucose, Bld: 88 mg/dL (ref 70–99)
Potassium: 4.1 mEq/L (ref 3.5–5.1)
Sodium: 139 mEq/L (ref 135–145)

## 2011-07-17 NOTE — Progress Notes (Signed)
CARDIAC REHAB PHASE I   PRE:  Rate/Rhythm: 93 sinus  BP:  Supine:   Sitting: 102/68  Standing:    SaO2:   MODE:  Ambulation: 800 ft   POST:  Rate/Rhythem: 89 sinus  BP:  Supine:   Sitting: 126/81  Standing:    SaO2: 93%  Pt ambulated 857ft with assist x1 but has been walking with wife as well.  Pt tolerated walk well and is aware of stent in chest.  Pt had some SOB with walk, feeling something on the right side.  Pt returned to bedside and answered some follow up questions about Phase II cardiac rehab.  Pt left with MD. Fabio Pierce, MA, ACSM RCEP [phone]  Hazle Nordmann

## 2011-07-17 NOTE — Discharge Summary (Signed)
Jason Persky C. Alexzandrea Normington, MD, FACC Attending Cardiologist [facility]  

## 2011-07-17 NOTE — Progress Notes (Signed)
[facility]  DAILY PROGRESS NOTE   Subjective:  No events overnight. No chest pain. Ambulating without difficulty.  Objective:  Temp:  [97.6 F (36.4 C)-98.1 F (36.7 C)] 97.6 F (36.4 C) (05/11 0629) Pulse Rate:  [77-81] 77  (05/11 0629) Resp:  [16-18] 16  (05/11 0629) BP: (98-130)/(63-86) 98/63 mmHg (05/11 0629) SpO2:  [92 %-95 %] 92 % (05/11 0629) Weight change:   Intake/Output from previous day:    Intake/Output from this shift:    Medications: Current Facility-Administered Medications  Medication Dose Route Frequency Provider Last Rate Last Dose  . acetaminophen (TYLENOL) tablet 650 mg  650 mg Oral Q4H PRN [name], MD      . aspirin chewable tablet 81 mg  81 mg Oral Daily [name], MD   81 mg at 07/17/11 1002  . atorvastatin (LIPITOR) tablet 40 mg  40 mg Oral q1800 [name], MD   40 mg at 07/16/11 1647  . carvedilol (COREG) tablet 6.25 mg  6.25 mg Oral BID WC [name], MD   6.25 mg at 07/17/11 1002  . lisinopril (PRINIVIL,ZESTRIL) tablet 10 mg  10 mg Oral Daily Eda Paschal [place], [place]   10 mg at 07/17/11 1002  . morphine 2 MG/ML injection 2 mg  2 mg Intravenous Q1H PRN [name], MD      . nitroGLYCERIN (NITROSTAT) SL tablet 0.4 mg  0.4 mg Sublingual Q5 Min x 3 PRN Abelino Derrick, [place]      . ondansetron [facility]) injection 4 mg  4 mg Intravenous Q6H PRN [name], MD      . prasugrel (EFFIENT) tablet 10 mg  10 mg Oral Daily [name], MD   10 mg at 07/17/11 1001  . zolpidem (AMBIEN) tablet 10 mg  10 mg Oral QHS PRN Abelino Derrick, PA   10 mg at 07/16/11 2326    Physical Exam: General appearance: alert and no distress Neck: no adenopathy, no carotid bruit, no JVD, supple, symmetrical, trachea midline and thyroid not enlarged, symmetric, no tenderness/mass/nodules Lungs: clear to auscultation bilaterally Heart: regular rate and rhythm, S1, S2 normal, no murmur, click, rub or gallop Abdomen: soft,  non-tender; bowel sounds normal; no masses,  no organomegaly  Lab Results: Results for orders placed during the hospital encounter of 07/14/11 (from the past 48 hour(s))  CARDIAC PANEL(CRET KIN+CKTOT+MB+TROPI)     Status: Abnormal   Collection Time   07/15/11  3:50 PM      Component Value Range Comment   Total CK 458 (*) 7 - 232 (U/L)    CK, MB 50.4 (*) 0.3 - 4.0 (ng/mL) CRITICAL VALUE NOTED.  VALUE IS CONSISTENT WITH PREVIOUSLY REPORTED AND CALLED VALUE.   Troponin I 9.43 (*) <0.30 (ng/mL)    Relative Index 11.0 (*) 0.0 - 2.5    BASIC METABOLIC PANEL     Status: Normal   Collection Time   07/17/11  6:55 AM      Component Value Range Comment   Sodium 139  135 - 145 (mEq/L)    Potassium 4.1  3.5 - 5.1 (mEq/L)    Chloride 103  96 - 112 (mEq/L)    CO2 24  19 - 32 (mEq/L)    Glucose, Bld 88  70 - 99 (mg/dL)    BUN 14  6 - 23 (mg/dL)    Creatinine, Ser 0.86  0.50 - 1.35 (mg/dL)    Calcium 8.7  8.4 - 10.5 (mg/dL)    GFR calc non Af Amer >90  >90 (mL/min)    GFR calc Af Amer >90  >90 (mL/min)     Imaging: No results found.  Assessment:  1. Principal Problem: 2.  *STEMI, urgent RCA DES 07/15/11 3. Active Problems: 4.  HLD (hyperlipidemia) 5.  CAD, residual LAD disease, Nl LVF 6.  Family history of early CAD 7.  Obesity (BMI 30-39.9) 8.  Metabolic syndrome, obese, elevated glucose, Hgb A1c 6.1 9.   Plan:  1. Stable s/p PCI to the RCA for acute inferior STEMI. There is residual LAD disease which will be evaluated by a lexiscan myoview in 2 weeks as an outpatient. Follow-up with Dr. [name] thereafter. He will be discharged on ASA 81 mg, lipitor 40 mg, coreg 6.25 mg BID, lisinopril 10 mg daily and Effient 10 mg daily. LV function appears to be preserved. Troponin is decreasing. Will wait on cardiac rehabilitation until after complete revascularization. Ok for d/c home today.  Time Spent Directly with Patient:  15 minutes  Length of Stay:  LOS: 3 days   Chrystie Nose, MD,  [facility] Attending Cardiologist [facility]  Arles Rumbold C 07/17/2011, 11:05 AM

## 2011-09-02 ENCOUNTER — Other Ambulatory Visit: Payer: Self-pay | Admitting: Cardiology

## 2011-09-03 ENCOUNTER — Encounter (HOSPITAL_COMMUNITY): Payer: Self-pay | Admitting: Pharmacy Technician

## 2011-09-07 ENCOUNTER — Encounter (HOSPITAL_COMMUNITY)
Admission: RE | Disposition: A | Discharge status: Home / Self Care | Payer: Self-pay | Source: Ambulatory Visit | Attending: Cardiology

## 2011-09-07 ENCOUNTER — Ambulatory Visit (HOSPITAL_COMMUNITY)
Admission: RE | Admit: 2011-09-07 | Discharge: 2011-09-08 | Disposition: A | Discharge status: Home / Self Care | Payer: BC Managed Care – PPO | Source: Ambulatory Visit | Attending: Cardiology | Admitting: Cardiology

## 2011-09-07 ENCOUNTER — Encounter (HOSPITAL_COMMUNITY): Payer: Self-pay | Admitting: Cardiology

## 2011-09-07 DIAGNOSIS — Z8249 Family history of ischemic heart disease and other diseases of the circulatory system: Secondary | ICD-10-CM

## 2011-09-07 DIAGNOSIS — E785 Hyperlipidemia, unspecified: Secondary | ICD-10-CM | POA: Diagnosis present

## 2011-09-07 DIAGNOSIS — R9439 Abnormal result of other cardiovascular function study: Secondary | ICD-10-CM | POA: Diagnosis present

## 2011-09-07 DIAGNOSIS — E669 Obesity, unspecified: Secondary | ICD-10-CM | POA: Diagnosis present

## 2011-09-07 DIAGNOSIS — E8881 Metabolic syndrome: Secondary | ICD-10-CM | POA: Diagnosis present

## 2011-09-07 DIAGNOSIS — I2510 Atherosclerotic heart disease of native coronary artery without angina pectoris: Secondary | ICD-10-CM | POA: Diagnosis present

## 2011-09-07 HISTORY — DX: Metabolic syndrome: E88.810

## 2011-09-07 HISTORY — PX: PERCUTANEOUS CORONARY STENT INTERVENTION (PCI-S): SHX5485

## 2011-09-07 HISTORY — DX: Essential (primary) hypertension: I10

## 2011-09-07 HISTORY — DX: Atherosclerotic heart disease of native coronary artery without angina pectoris: I25.10

## 2011-09-07 HISTORY — PX: LEFT HEART CATHETERIZATION WITH CORONARY ANGIOGRAM: SHX5451

## 2011-09-07 HISTORY — DX: Metabolic syndrome: E88.81

## 2011-09-07 HISTORY — DX: Obesity, unspecified: E66.9

## 2011-09-07 HISTORY — DX: Hyperlipidemia, unspecified: E78.5

## 2011-09-07 HISTORY — DX: Unspecified chronic bronchitis: J42

## 2011-09-07 HISTORY — DX: ST elevation (STEMI) myocardial infarction involving other coronary artery of inferior wall: I21.19

## 2011-09-07 SURGERY — LEFT HEART CATHETERIZATION WITH CORONARY ANGIOGRAM
Anesthesia: LOCAL | Site: Hand | Laterality: Right

## 2011-09-07 MED ORDER — DIAZEPAM 5 MG PO TABS
5.0000 mg | ORAL_TABLET | ORAL | Status: AC
  Administered 2011-09-07: 5 mg via ORAL
  Filled 2011-09-07: qty 1

## 2011-09-07 MED ORDER — SODIUM CHLORIDE 0.9 % IJ SOLN: 3.0000 mL | Freq: Two times a day (BID) | INTRAMUSCULAR | Status: DC

## 2011-09-07 MED ORDER — MORPHINE SULFATE 4 MG/ML IJ SOLN: 2.0000 mg | INTRAMUSCULAR | Status: DC | PRN

## 2011-09-07 MED ORDER — SODIUM CHLORIDE 0.9 % IV SOLN
INTRAVENOUS | Status: DC
  Administered 2011-09-07: 08:00:00 via INTRAVENOUS

## 2011-09-07 MED ORDER — NITROGLYCERIN 0.4 MG SL SUBL: 0.4000 mg | SUBLINGUAL_TABLET | SUBLINGUAL | Status: DC | PRN

## 2011-09-07 MED ORDER — BIVALIRUDIN 250 MG IV SOLR
INTRAVENOUS | Status: AC
  Filled 2011-09-07: qty 250

## 2011-09-07 MED ORDER — CARVEDILOL 6.25 MG PO TABS
6.2500 mg | ORAL_TABLET | Freq: Two times a day (BID) | ORAL | Status: DC
  Filled 2011-09-07 (×4): qty 1

## 2011-09-07 MED ORDER — ATORVASTATIN CALCIUM 40 MG PO TABS
40.0000 mg | ORAL_TABLET | Freq: Every day | ORAL | Status: DC
  Filled 2011-09-07 (×2): qty 1

## 2011-09-07 MED ORDER — ONDANSETRON HCL 4 MG/2ML IJ SOLN: 4.0000 mg | Freq: Four times a day (QID) | INTRAMUSCULAR | Status: DC

## 2011-09-07 MED ORDER — TRAMADOL HCL 50 MG PO TABS
50.0000 mg | ORAL_TABLET | Freq: Four times a day (QID) | ORAL | Status: DC | PRN
  Filled 2011-09-07: qty 1

## 2011-09-07 MED ORDER — FENTANYL CITRATE 0.05 MG/ML IJ SOLN
INTRAMUSCULAR | Status: AC
  Filled 2011-09-07: qty 2

## 2011-09-07 MED ORDER — SODIUM CHLORIDE 0.9 % IV SOLN: 1.0000 mL/kg/h | INTRAVENOUS | Status: AC

## 2011-09-07 MED ORDER — ALPRAZOLAM 0.25 MG PO TABS: 0.2500 mg | ORAL_TABLET | Freq: Three times a day (TID) | ORAL | Status: DC | PRN

## 2011-09-07 MED ORDER — NITROGLYCERIN 0.2 MG/ML ON CALL CATH LAB
INTRAVENOUS | Status: AC
  Filled 2011-09-07: qty 1

## 2011-09-07 MED ORDER — HEPARIN (PORCINE) IN NACL 2-0.9 UNIT/ML-% IJ SOLN
INTRAMUSCULAR | Status: AC
  Filled 2011-09-07: qty 2000

## 2011-09-07 MED ORDER — ONDANSETRON HCL 4 MG/2ML IJ SOLN: 4.0000 mg | Freq: Four times a day (QID) | INTRAMUSCULAR | Status: DC | PRN

## 2011-09-07 MED ORDER — ADENOSINE 12 MG/4ML IV SOLN
16.0000 mL | Freq: Once | INTRAVENOUS | Status: DC
  Filled 2011-09-07: qty 16

## 2011-09-07 MED ORDER — LIDOCAINE HCL (PF) 1 % IJ SOLN
INTRAMUSCULAR | Status: AC
  Filled 2011-09-07: qty 30

## 2011-09-07 MED ORDER — SODIUM CHLORIDE 0.9 % IJ SOLN: 3.0000 mL | INTRAMUSCULAR | Status: DC | PRN

## 2011-09-07 MED ORDER — ACETAMINOPHEN 325 MG PO TABS
650.0000 mg | ORAL_TABLET | ORAL | Status: DC | PRN
  Administered 2011-09-08: 650 mg via ORAL
  Filled 2011-09-07: qty 2

## 2011-09-07 MED ORDER — SODIUM CHLORIDE 0.9 % IV SOLN: 250.0000 mL | INTRAVENOUS | Status: DC

## 2011-09-07 MED ORDER — MIDAZOLAM HCL 2 MG/2ML IJ SOLN
INTRAMUSCULAR | Status: AC
  Filled 2011-09-07: qty 2

## 2011-09-07 MED ORDER — ACETAMINOPHEN 325 MG PO TABS: 650.0000 mg | ORAL_TABLET | ORAL | Status: DC | PRN

## 2011-09-07 MED ORDER — ZOLPIDEM TARTRATE 5 MG PO TABS
10.0000 mg | ORAL_TABLET | Freq: Every evening | ORAL | Status: DC | PRN
  Administered 2011-09-07: 10 mg via ORAL
  Filled 2011-09-07: qty 2

## 2011-09-07 MED ORDER — ASPIRIN 81 MG PO CHEW: 81.0000 mg | CHEWABLE_TABLET | Freq: Every day | ORAL | Status: DC

## 2011-09-07 MED ORDER — ISOSORBIDE MONONITRATE ER 30 MG PO TB24
30.0000 mg | ORAL_TABLET | Freq: Every day | ORAL | Status: DC
  Administered 2011-09-07: 30 mg via ORAL
  Filled 2011-09-07 (×2): qty 1

## 2011-09-07 MED ORDER — PRASUGREL HCL 10 MG PO TABS
10.0000 mg | ORAL_TABLET | Freq: Every day | ORAL | Status: DC
  Filled 2011-09-07: qty 1

## 2011-09-07 NOTE — CV Procedure (Signed)
[facility] PERCUTANEOUS CORONARY INTERVENTION REPORT  NAME:  Jason Mathis   MRN: [id number] DOB:  03/06/1956   ADMIT DATE: 09/07/2011  INTERVENTIONAL CARDIOLOGIST: [name], M.D., MS PRIMARY CARE PROVIDER: [name], MD PRIMARY CARDIOLOGIST: [name], MD, MS   PATIENT:  Jason Mathis is a 56 y.o. male who is ~6 weeks s/p Inferior STEMI with PCI of the RCA. Coronary Angiography at that time also revealed high grade a LAD lesion that was recently evaluated with a Myoview ST that demonstrated ischemia in the LAD distribution along with RCA distribution scar with peri-infarct ischemia.  He was seen by [name] on my behalf last week,and scheduled for re-look angiography of the RCA and possible PCI of the LAD. Will begin with Fractional Flow Reserve (FFR) as the anterior ischemia was described as "mild".  PRE-OPERATIVE DIAGNOSIS:    Abnormal Myoview with Inferior & Anteroapical Ischemia - mild  Recent Inferior STEMI - with PCI of RCA, known LAD lesion  PROCEDURES PERFORMED:    Coronary Angiography only  Fractional Flow Reserve Measurement of the Prox-Mid LAD lesion; final ratio = 0.68, HIGH RISK  Percutaneous Coronary Intervention of the ~70-85% Proximal to Mid LAD lesion with a Promus DES 4.0 mm x 28 mm - post-dilated to 4.5 mm proximal-mid stent.  PROCEDURE: Consent: The procedure with Risks/Benefits/Alternatives and Indications was reviewed with the patient (and family).  All questions were answered.    Risks / Complications include, but not limited to: Death, MI, CVA/TIA, VF/VT (with defibrillation), Bradycardia (need for temporary pacer placement), contrast induced nephropathy, bleeding / bruising / hematoma / pseudoaneurysm, vascular or coronary injury (with possible emergent CT or Vascular Surgery), adverse medication reactions, infection.    The patient and familyvoice understanding and agree to proceed.   Consent for signed by MD and  patient with RN witness -- placed on chart.  Procedure: The patient was brought to the 2nd Floor [facility] in the fasting state and prepped and draped in the usual sterile fashion for Right groin and radial access.  A modified Allen's test with plethysmography was performed on the right wrist demonstrating adequate Ulnar Artery collateral flow.  Sterile technique was used including antiseptics, cap, gloves, gown, hand hygiene, mask and sheet.  Skin prep: Chlorhexidine;  Time Out: Verified patient identification, verified procedure, site/side was marked, verified correct patient position, special equipment/implants available, medications/allergies/relevent history reviewed, required imaging and test results available.  Performed   Access: Right Radial Artery; 6 Fr Glide Sheath - Seldinger Technique.  Diagnostic:  Initial catheter advanced over Versicore wire, echanged over Marriott wire.   RCA Angiography - JR4  RCA: Widely patent proximal stent, mid ~30-40% lesion, remainder of the vessel including the RPDA and RPL system were widely patent with minimal luminal irregularities.  Left Circumflex - widely patent, minimal luminal irregularities.   Ramus Intermedius -- widely patent, minimal luminal irregularities.  PCI:  Proximal LAD 70--85% lesion beginning proximally, continues to beyond the D1 and SP1 bifurcation.  Guide: 6 Fr   XB LAD 3.5  Guidewire: [place] FFR Wire; Prowater for Diagonal in case salvage PTCA required. FFR - The FFR wire was easily advanced to the distal Mid LAD.  A IVF bolus was administered to maintain a stable BP.  Adenosine was infused at the standard rate for a total of 90 seconds and stopped when the ratio rapidly dropped from the baseline reading of 0.89 to 0.68 --> HIGH RISK. Therefore the  decision was made to proceed with PCI of the lesion.  Predilation Balloon: Emerge MR 3.0 mm x 20 mm; 8 Atm x 23 Sec, 10 Atm x  34 Sec  Stent: Promus Element DES 4.0 mm x 28 mm; 12 Atm x 40 Sec  Post-dilation Balloon: [place] Quantum Apex 4.5 mm x 20 mm; 16 Atm x 34 Sec; Prox-mid stent -- final diameter 4.55 mm  Hemodynamics:  Central Aortic / Mean Pressures: 78/46 mmHg; 68 mmHg - final BP 100/66 mmHg  TR Band:  1040 Hours, 10 mL air  ANESTHESIA:   Local Lidocaine 2 ml SEDATION:  3 mg IV Versed, 50 mcg IV fentanyl ; Premedication: Valium 5mg  PO MEDICATIONS: Angiomax bolus and drip  Adenosine Infusion for FFR  IV Normal Saline - 750 ml  Contrast:  EBL:   < 5 ml  PATIENT DISPOSITION:    The patient tolerated the procedure well with no complications.  He was stable before, during & after the procedure.  He was transported to the PACU holding area in a hemodynamically stable, chest pain free condition.  POST-OPERATIVE DIAGNOSIS:    High Risk FFR of 0.68 across the Proximal to Mid LAD 70-85% lesion.  Successful PCI of the Prox-Mid LAD, minor "pinching of D1 ostium ~40%" and SP1 ~50-60% with TIMI 3 flow.  PLAN OF CARE:  Overnight Observation - post radial cath care  Continue DAPT  Reduce Lisinopril to 5mg  po on d/c & add Imdur    Serra Younan W, M.D., M.S. [facility] [address] [place], [place]  [address]  [phone]  09/07/2011 10:52 AM

## 2011-09-07 NOTE — H&P (Addendum)
History and Physical Interval Note:  NAME:  Jason Mathis   MRN: [id number] DOB:  [birth year]   ADMIT DATE: 09/07/2011   09/07/2011 7:37 AM  Jason Mathis is a 56 y.o. male who is ~6 weeks s/p Inferior STEMI with PCI of the RCA.  Coronary Angiography at that time also revealed high grade a LAD lesion that was recently evaluated with a Myoview ST that demonstrated ischemia in the LAD distribution along with RCA distribution scar with peri-infarct ischemia.   He was seen by [name] on my behalf last week,and scheduled for re-look angiography of the RCA and possible PCI of the LAD.  Will begin with FFR.   Past Medical History  Diagnosis Date  . Kidney stones   . ST elevation myocardial infarction (STEMI) of inferior wall 07/2011    PCI of Proximal RCA with a Promus Element 3.0 mm x 16 mm DES; final diameter 3.25 mm  . CAD (coronary artery disease), native coronary artery 07/2011    Additional ~70-80% LAD lesion; Preserved LVEF  . Dyslipidemia, goal LDL below 70   . Obesity (BMI 30-39.9)   . Metabolic syndrome     Hgb AiC 6.1   Past Surgical History  Procedure Date  . Cardiac Cath - 2007, non-obstructive CAD.     angiogram - PCI of RCA with DES  07/2011  . Knee arthroscopy 11/2007    Left Knee Meniscal Tear    FAMHx: Family History  Problem Relation Age of Onset  . Coronary artery disease Father 66    MI at 51y/o - died    SOCHx:  reports that he has never smoked. He does not have any smokeless tobacco history on file. He reports that he does not drink alcohol or use illicit drugs.  ALLERGIES: No Known Allergies  HOME MEDICATIONS: Prescriptions prior to admission  Medication Sig Dispense Refill  . aspirin 81 MG chewable tablet Chew 81 mg by mouth daily.      Marland Kitchen atorvastatin (LIPITOR) 40 MG tablet Take 40 mg by mouth daily at 6 PM.      . carvedilol (COREG) 6.25 MG tablet Take 6.25 mg by mouth 2 (two) times daily with a meal.      . lisinopril (PRINIVIL,ZESTRIL) 10 MG tablet Take  10 mg by mouth daily.      . nitroGLYCERIN (NITROSTAT) 0.4 MG SL tablet Place 0.4 mg under the tongue every 5 (five) minutes as needed. For chest pain      . prasugrel (EFFIENT) 10 MG TABS Take 10 mg by mouth daily.        PHYSICAL EXAM:Blood pressure 102/67, pulse 78, temperature 97.1 F (36.2 C), temperature source Oral, resp. rate 18, height 6\' 2"  (1.88 m), weight 104.327 kg (230 lb), SpO2 95.00%. General appearance: alert, cooperative, appears stated age, no distress and pleasant mood & affect Neck: no adenopathy, no carotid bruit, no JVD, supple, symmetrical, trachea midline and thyroid not enlarged, symmetric, no tenderness/mass/nodules Lungs: clear to auscultation bilaterally, normal percussion bilaterally and non-labored Heart: regular rate and rhythm, S1, S2 normal, no murmur, click, rub or gallop Abdomen: soft, non-tender; bowel sounds normal; no masses,  no organomegaly Extremities: extremities normal, atraumatic, no cyanosis or edema Pulses: 2+ and symmetric Neurologic: Grossly normal; CN II-XII grossly intact.  IMPRESSION & PLAN The patients' history has been reviewed, patient examined, no change in status from most recent note, stable for surgery. I have reviewed the patients' chart and labs. Questions were answered to the patient's satisfaction.  Jason Mathis has presented today for surgery, with the diagnosis of Abnormal Myoview Stress Test with known LAD lesion.  The various methods of treatment have been discussed with the patient and family.   Risks / Complications include, but not limited to: Death, MI, CVA/TIA, VF/VT (with defibrillation), Bradycardia (need for temporary pacer placement), contrast induced nephropathy, bleeding / bruising / hematoma / pseudoaneurysm, vascular or coronary injury (with possible emergent CT or Vascular Surgery), adverse medication reactions, infection.     After consideration of risks, benefits and other options for treatment, the patient  has consented to Procedure(s):  CORONARY ANGIOGRAPHY  Fractional Flow Reserve measurement of LAD Possible PERCUTANEOUS CORONARY INTERVENTION of the LAD  as a surgical intervention.   We will proceed with the planned procedure.   Rushi Chasen W [facility] [address] [place], [place]  [address]  [phone]  09/07/2011 7:37 AM

## 2011-09-08 LAB — CBC
Hemoglobin: 12.5 g/dL — ABNORMAL LOW (ref 13.0–17.0)
MCH: 29 pg (ref 26.0–34.0)
MCHC: 33.2 g/dL (ref 30.0–36.0)
MCV: 87.5 fL (ref 78.0–100.0)

## 2011-09-08 LAB — BASIC METABOLIC PANEL
Calcium: 8.5 mg/dL (ref 8.4–10.5)
Creatinine, Ser: 0.87 mg/dL (ref 0.50–1.35)
GFR calc non Af Amer: >90 mL/min (ref >90)
Glucose, Bld: 96 mg/dL (ref 70–99)
Sodium: 138 mEq/L (ref 135–145)

## 2011-09-08 MED ORDER — ISOSORBIDE MONONITRATE ER 30 MG PO TB24: 30.0000 mg | ORAL_TABLET | Freq: Every day | ORAL | Status: DC

## 2011-09-08 MED ORDER — LISINOPRIL 5 MG PO TABS: 5.0000 mg | ORAL_TABLET | Freq: Every day | ORAL | Status: DC

## 2011-09-08 MED ORDER — ACETAMINOPHEN 325 MG PO TABS: 650.0000 mg | ORAL_TABLET | ORAL | Status: AC | PRN

## 2011-09-08 MED ORDER — NITROGLYCERIN 0.4 MG SL SUBL: 0.4000 mg | SUBLINGUAL_TABLET | SUBLINGUAL | Status: DC | PRN

## 2011-09-08 MED FILL — Dextrose Inj 5%: INTRAVENOUS | Qty: 50 | Status: AC

## 2011-09-08 NOTE — Progress Notes (Signed)
Pt walking independently without problems. Has made many dietary and ex changes and is doing well. Encouraged and reinforced. CRPII will call him next week. All needs met. [phone] Ethelda Chick CES, ACSM

## 2011-09-08 NOTE — Discharge Summary (Signed)
Patient ID: Jason Mathis,  MRN: [id number], DOB/AGE: 03-20-1955 56 y.o.  Admit date: 09/07/2011 Discharge date: 09/08/2011  Primary Care Provider:  Primary Cardiologist: Dr [name]  Discharge Diagnoses Principal Problem:  *Abnormal nuclear stress test - LAD distrubution ischemia Active Problems:  CAD, DMI RCA sten 07/16/11, residual LAD disease, S/P LAD stent 09/07/11  Metabolic syndrome, obese, elevated glucose, Hgb A1c 6.1  HLD (hyperlipidemia)  Family history of early CAD  Obesity (BMI 30-39.9)  Procedures: Cath, LAD DES   Hospital Course: Jason Mathis is a 56 y.o. male who is ~6 weeks s/p Inferior STEMI with PCI of the RCA. Coronary Angiography at that time also revealed high grade a LAD lesion that was recently evaluated with a Myoview ST that demonstrated ischemia in the LAD distribution along with RCA distribution scar with peri-infarct ischemia.  He was seen by [name] on my behalf last week,and scheduled for re-look angiography of the RCA and possible PCI of the LAD. Will begin with Fractional Flow Reserve (FFR) as the anterior ischemia was described as "mild". This was done 09/07/11, results listed below. Dr [name] feels he can be discharged 09/08/11.  POST-OPERATIVE DIAGNOSIS:  High Risk FFR of 0.68 across the Proximal to Mid LAD 70-85% lesion.  Successful PCI of the Prox-Mid LAD, minor "pinching of D1 ostium ~40%" and SP1 ~50-60% with TIMI 3 flow.   Discharge Vitals:  Blood pressure 112/63, pulse 73, temperature 98 F (36.7 C), temperature source Oral, resp. rate 20, height 6\' 2"  (1.88 m), weight 106.8 kg (235 lb 7.2 oz), SpO2 97.00%.    Labs: Results for orders placed during the hospital encounter of 09/07/11 (from the past 48 hour(s))  CBC     Status: Abnormal   Collection Time   09/08/11  3:48 AM      Component Value Range Comment   WBC 6.3  4.0 - 10.5 K/uL    RBC 4.31  4.22 - 5.81 MIL/uL    Hemoglobin 12.5 (*) 13.0 - 17.0 g/dL    HCT 78.2 (*) 95.6 - 52.0 %    MCV  87.5  78.0 - 100.0 fL    MCH 29.0  26.0 - 34.0 pg    MCHC 33.2  30.0 - 36.0 g/dL    RDW 21.3  08.6 - 57.8 %    Platelets 171  150 - 400 K/uL   BASIC METABOLIC PANEL     Status: Normal   Collection Time   09/08/11  3:48 AM      Component Value Range Comment   Sodium 138  135 - 145 mEq/L    Potassium 4.1  3.5 - 5.1 mEq/L    Chloride 104  96 - 112 mEq/L    CO2 23  19 - 32 mEq/L    Glucose, Bld 96  70 - 99 mg/dL    BUN 11  6 - 23 mg/dL    Creatinine, Ser 4.69  0.50 - 1.35 mg/dL    Calcium 8.5  8.4 - 62.9 mg/dL    GFR calc non Af Amer >90  >90 mL/min    GFR calc Af Amer >90  >90 mL/min     Disposition:  Follow-up Information    Follow up with Abelino Derrick, PA. (office will call)    Contact information:   [address] [place] [address] [phone]          Discharge Medications:  Medication List  As of 09/08/2011  9:40 AM   TAKE these  medications         acetaminophen 325 MG tablet   Commonly known as: TYLENOL   Take 2 tablets (650 mg total) by mouth every 4 (four) hours as needed.      aspirin 81 MG chewable tablet   Chew 81 mg by mouth daily.      atorvastatin 40 MG tablet   Commonly known as: LIPITOR   Take 40 mg by mouth daily at 6 PM.      carvedilol 6.25 MG tablet   Commonly known as: COREG   Take 6.25 mg by mouth 2 (two) times daily with a meal.      isosorbide mononitrate 30 MG 24 hr tablet   Commonly known as: IMDUR   Take 1 tablet (30 mg total) by mouth daily.      lisinopril 5 MG tablet   Commonly known as: PRINIVIL,ZESTRIL   Take 1 tablet (5 mg total) by mouth daily.      nitroGLYCERIN 0.4 MG SL tablet   Commonly known as: NITROSTAT   Place 0.4 mg under the tongue every 5 (five) minutes as needed. For chest pain      prasugrel 10 MG Tabs   Commonly known as: EFFIENT   Take 10 mg by mouth daily.            Duration of Discharge Encounter: Greater than 30 minutes including physician time.  Signed, Corine Shelter  PA-C 09/08/2011 9:40 AM  I saw & examined the patient on the day of discharge.  He is doing great.  Tolerated PCI to LAD well, ambulating without difficulty.  Labs reviewed & are all WNL. Blood pressure 112/63, pulse 73, temperature 98 F (36.7 C), temperature source Oral, resp. rate 20, height 6\' 2"  (1.88 m), weight 106.8 kg (235 lb 7.2 oz), SpO2 97.00%. General appearance: alert, cooperative, appears stated age, no distress and very pleasant mood & affect.   Neck: no adenopathy, no carotid bruit, no JVD, supple, symmetrical, trachea midline and thyroid not enlarged, symmetric, no tenderness/mass/nodules Lungs: clear to auscultation bilaterally, normal percussion bilaterally and non-labored Heart: regular rate and rhythm, S1, S2 normal, no murmur, click, rub or gallop Abdomen: soft, non-tender; bowel sounds normal; no masses,  no organomegaly Extremities: extremities normal, atraumatic, no cyanosis or edema and R radial cath site, c/d/i Pulses: 2+ and symmetric Normal Reverse Barbaeu test of L Radial/Ulnar artery CN II-XII grossly intact.  Impression: Stable s/p PCI of prox LAD for Abn Myoview & FFR of 0.68 Plan: d/c today on reduced (1/2 dose) ACE-I, low dose Imdur & Coreg along with statin & DAPT.  ROV with extender for post-cath check next week or two, then with me in ~2-3 months.  [name], M.D., M.S. [facility] [address] [place], [place]  [address]  [phone] Pager # [phone]  09/08/2011 12:12 PM

## 2012-06-06 DIAGNOSIS — R55 Syncope and collapse: Secondary | ICD-10-CM

## 2012-06-06 HISTORY — DX: Syncope and collapse: R55

## 2012-10-09 ENCOUNTER — Other Ambulatory Visit: Payer: Self-pay

## 2012-10-09 MED ORDER — NITROGLYCERIN 0.4 MG SL SUBL: 0.4000 mg | SUBLINGUAL_TABLET | SUBLINGUAL | Status: DC | PRN

## 2012-10-09 NOTE — Telephone Encounter (Signed)
Rx was sent to pharmacy electronically. 

## 2012-11-20 ENCOUNTER — Encounter: Payer: Self-pay | Admitting: *Deleted

## 2012-11-23 ENCOUNTER — Ambulatory Visit: Payer: BC Managed Care – PPO | Admitting: Cardiology

## 2012-11-27 ENCOUNTER — Encounter: Payer: Self-pay | Admitting: Cardiology

## 2012-11-28 ENCOUNTER — Encounter: Payer: Self-pay | Admitting: Cardiology

## 2012-11-28 ENCOUNTER — Ambulatory Visit (INDEPENDENT_AMBULATORY_CARE_PROVIDER_SITE_OTHER): Payer: BC Managed Care – PPO | Admitting: Cardiology

## 2012-11-28 VITALS — BP 122/72 | HR 79 | Ht 74.0 in | Wt 232.0 lb

## 2012-11-28 DIAGNOSIS — Z79899 Other long term (current) drug therapy: Secondary | ICD-10-CM

## 2012-11-28 DIAGNOSIS — I2119 ST elevation (STEMI) myocardial infarction involving other coronary artery of inferior wall: Secondary | ICD-10-CM | POA: Insufficient documentation

## 2012-11-28 DIAGNOSIS — I219 Acute myocardial infarction, unspecified: Secondary | ICD-10-CM

## 2012-11-28 DIAGNOSIS — E669 Obesity, unspecified: Secondary | ICD-10-CM

## 2012-11-28 DIAGNOSIS — I10 Essential (primary) hypertension: Secondary | ICD-10-CM | POA: Insufficient documentation

## 2012-11-28 DIAGNOSIS — E785 Hyperlipidemia, unspecified: Secondary | ICD-10-CM

## 2012-11-28 DIAGNOSIS — I2510 Atherosclerotic heart disease of native coronary artery without angina pectoris: Secondary | ICD-10-CM

## 2012-11-28 DIAGNOSIS — R55 Syncope and collapse: Secondary | ICD-10-CM

## 2012-11-28 LAB — COMPREHENSIVE METABOLIC PANEL
ALT: 22 U/L (ref 0–53)
AST: 19 U/L (ref 0–37)
CO2: 27 mEq/L (ref 19–32)
Calcium: 9 mg/dL (ref 8.4–10.5)
Chloride: 103 mEq/L (ref 96–112)
Creat: 0.81 mg/dL (ref 0.50–1.35)
Potassium: 4.2 mEq/L (ref 3.5–5.3)
Sodium: 136 mEq/L (ref 135–145)
Total Protein: 6.8 g/dL (ref 6.0–8.3)

## 2012-11-28 LAB — LIPID PANEL
LDL Cholesterol: 165 mg/dL — ABNORMAL HIGH (ref 0–99)
Triglycerides: 167 mg/dL — ABNORMAL HIGH (ref <150)
VLDL: 33 mg/dL (ref 0–40)

## 2012-11-28 NOTE — Patient Instructions (Signed)
Your physician wants you to follow-up in 6 months Dr [name].   You will receive a reminder letter in the mail two months in advance. If you don't receive a letter, please call our office to schedule the follow-up appointment.   LIPIDS,CMP today

## 2012-11-28 NOTE — Progress Notes (Signed)
PCP: [name], MD  Clinic Note: Chief Complaint  Patient presents with  . ROV 4 months    No complaints   HPI: Jason Mathis is a 57 y.o. male with a PMH below who presents today for this five-month followup.Marland Kitchen He is a very pleasant gentleman who I met during his inferior STEMI in May 2013. He had an occluded RCA for anterior STEMI and was treated with PCI. He also had an LAD lesion noted. We evaluated this with a stress test that showed clearcut LAD ischemia, he was in treatment staged PCI of the LAD. He has had echocardiographic and nuclear stress test evaluation which showed preserved ejection fraction and no scar. I saw back in April in followup after visit where he had an episode of syncope. It basically proved to be without like a micturition syncope exacerbated by hypotension and orthostatic hypotension. We stopped his ACE inhibitor at that time. He has had intolerance of statins mostly due to memory issues. So not to use statins.  Interval History: He is here today, almost apologetic for having at about 15 pounds from his last visit. He has not been exercising much due to the heat and excessive work. He has occasional leg cramping, but is had a lot of tightness across his back and chest wall, probably brought on by stress. He went to see a chiropractor has had treatments that made it feel much better. He denies any chest tightness or pressure plus or minus dyspnea with rest or exertion. No PND, orthopnea or edema to suggest heart failure. No palpitations or rapid heart beats. No recurrent episodes of syncope or near syncope. He is clearly watching his dietary intake and pay attention to when he stands up from going to the bathroom.  The remainder of Cardiovascular ROS is as follows: no chest pain or dyspnea on exertion negative for - edema, irregular heartbeat, loss of consciousness, murmur, orthopnea, palpitations, paroxysmal nocturnal dyspnea, rapid heart rate or shortness of breath   Additional cardiac review of systems: Lightheadedness - no, dizziness - no, syncope/near-syncope - no; TIA/amaurosis fugax - no Melena - no, hematochezia no; hematuria - no; nosebleeds - no; claudication - no  Past Medical History  Diagnosis Date  . History of ST elevation myocardial infarction (STEMI) of inferior wall 07/15/2011    inferior; PCI of Proximal RCA & LAD  . CAD (coronary artery disease), native coronary artery 07/2011    Additional ~70-80% LAD lesion; Preserved LVEF  . History of nuclear stress test 08/24/2011    bruce protocol; mild ischemia in apical lateral regions; mild perfusion defect due to infarct/scar with mod-severe perinfarct ischemia in basal inferoseptal/basal inferior/mid inferoseptal/mid inferior regions; high risk    . CAD S/P percutaneous coronary angioplasty 09/07/2011    PCI of the LAD - Promus Element DES 4.0x24mm postdilated to 4.25mm  . H/O echocardiogram 07/15/2011    Normal EF 55-60%; no WMA  . Hypertension   . Dyslipidemia, goal LDL below 70 07/15/2011  . Metabolic syndrome     Hgb AiC 6.1 -- low HDL, elevated blood glucose, hypertension  . Obesity (BMI 30-39.9)   . Kidney stones   . Chronic bronchitis     "used to get it every year til a few years ago" (09/07/11)  . History of pneumonia 2003  . Syncope and collapse April 2014    Post-micturition with orthostatic tendencies.    Prior Cardiac Evaluation   Procedure Laterality Date  . Coronary angioplasty with stent placement  07/2011    PCI of RCA with Promus Element 3.0 mm x 16 mm DES; final diameter 3.25 mm  . Coronary angioplasty with stent placement  09/07/2011    PCI of LAD with Promus Element DES 4.0x71mm postdilated to 4.42mm  . Transthoracic echocardiogram  07/15/2011    EF 55-60%    Allergies  Allergen Reactions  . Atorvastatin     Memory issues  . Crestor [Rosuvastatin]     Memory issues   . Zetia [Ezetimibe]     Myalgias     Current Outpatient Prescriptions  Medication Sig  Dispense Refill  . aspirin 81 MG chewable tablet Chew 81 mg by mouth daily.      . carvedilol (COREG) 6.25 MG tablet Take 6.25 mg by mouth 2 (two) times daily with a meal.      . nitroGLYCERIN (NITROSTAT) 0.4 MG SL tablet Place 1 tablet (0.4 mg total) under the tongue every 5 (five) minutes as needed. For chest pain  25 tablet  9  . prasugrel (EFFIENT) 10 MG TABS Take 10 mg by mouth daily.      . fenofibrate (TRICOR) 48 MG tablet Take 1 tablet (48 mg total) by mouth daily.  30 tablet  6   No current facility-administered medications for this visit.    History   Social History Narrative   Married, father of 2. He is a Scientist, research (physical sciences). He exercises routinely about 3 days a week for about 60 minutes a day. Does not smoke does not drink. He has although the weight due to less frequent exercise and dietary modification.    ROS: A comprehensive Review of Systems - Negative except Pertinent symptoms noted above  PHYSICAL EXAM BP 122/72  Pulse 79  Ht 6\' 2"  (1.88 m)  Wt 232 lb (105.235 kg)  BMI 29.77 kg/m2 General appearance: alert, cooperative, appears stated age, no distress, mildly obese and He is relatively healthy appearing, but does appear little heavier than he was before. Oriented x3. Pleasant mood and affect. Neck: no adenopathy, no carotid bruit, no JVD and supple, symmetrical, trachea midline Lungs: clear to auscultation bilaterally, normal percussion bilaterally and Nonlabored, and a air movement Heart: regular rate and rhythm, S1, S2 normal, no murmur, click, rub or gallop and normal apical impulse Abdomen: soft, non-tender; bowel sounds normal; no masses,  no organomegaly Extremities: extremities normal, atraumatic, no cyanosis or edema Pulses: 2+ and symmetric Neurologic: Grossly normal HEENT: [place]/AT, EOMI, MMM, anicteric sclera  [id number] today: Yes Rate: 79 , Rhythm: NSR, normal ECG  Recent Labs: From a spring 2014:2168, and TG 199, HDL 41, LDL 107 --> down from  May 2013 DC 259, TG 284, HDL 37, LDL 165;  reportedly, he is no longer on statin or Zetia due to intolerance  ASSESSMENT / PLAN: Coronary atherosclerosis of native coronary artery Status post two-vessel PCI. Stable without any active cardiac symptoms. On beta blocker as well as aspirin and Effient for dual antiplatelet therapy (DAPT). ACE inhibitor was discontinued after his syncopal event.  S/P Acute myocardial infarction, unspecified site, episode of care unspecified No recurrent anginal symptoms, well-preserved EF with no evidence of scar on Myoview. No wall motion abnormalities on echo. On stable medical regimen.  He did well in cardiac rehabilitation, has continued to exercise, albeit less than he had before.  CAD S/P percutaneous coronary angioplasty: PCI RCA during STEMI, staged PCI to LAD after positive stress test one month post stenting Continue on DAPT. Not on a statin D2 intolerance  with muscle cramping and memory issues.  HLD (hyperlipidemia) He is due for a lipid profile and chemistries today -- is most recent labs are much improved with total cholesterol 168, HDL of 41 LDL 107 and triglycerides 99. Unfortunately that was when he was on good statin therapy.. He has not eaten so to get labs done. Based on these results will determine what we need to. I think we should try Lovaza.  HTN (hypertension), benign We actually stopped his ACE inhibitor to allow for more blood pressure after his syncopal event. The blood pressure is well-controlled today simply on beta blocker.  Syncope and collapse No further episodes. He is monitoring his intake and output to ensure that he is dehydrated. He is also paying attention to what he does when he first gets up in the bathroom. Hopefully having backed off on his antihypertensive regimen will reduce the likelihood of another event.  Encounter for long-term (current) use of other medications Due for full lipid panel and LFTs/CMP following  several months off of statin therapy. We'll need to determine the next agent to use.  Obesity (BMI 30-39.9) He had done so well the patient is a weight loss, now is gaining back some weight. His last weight recorded was about 220 pounds. He is now 232. He says he still try to do well eating but this has not been exercising much due to too much stress and time required for work. He also is limited due to the heat in the summer time.   Orders Placed This Encounter  Procedures  . Lipid panel  . Comprehensive metabolic panel  . EKG 12-Lead   No orders of the defined types were placed in this encounter.    Followup: 6 months  DAVID W. [name], M.D., M.S. [facility] [address] [place], [place]  [address]  [phone] Pager # [phone]

## 2012-11-29 ENCOUNTER — Telehealth: Payer: Self-pay | Admitting: *Deleted

## 2012-11-29 DIAGNOSIS — Z79899 Other long term (current) drug therapy: Secondary | ICD-10-CM

## 2012-11-29 DIAGNOSIS — E782 Mixed hyperlipidemia: Secondary | ICD-10-CM

## 2012-11-29 MED ORDER — FENOFIBRATE 48 MG PO TABS: 48.0000 mg | ORAL_TABLET | Freq: Every day | ORAL | Status: DC

## 2012-11-29 NOTE — Telephone Encounter (Signed)
Spoke to patient . Result  Given. Verbalized understanding. Prescription sent to [facility]. [facility] supply.  labslip will be sent out @1 /2015 for patient to followup.

## 2012-11-29 NOTE — Telephone Encounter (Signed)
Message copied by Tobin Chad on Wed Nov 29, 2012  1:51 PM ------      Message from: [facility], DAVID      Created: Tue Nov 28, 2012 10:47 PM       Triglycerides are definitely better --> lets try Tricor 48 mg & recheck in 4 months.            [name], MD       ------

## 2012-12-03 ENCOUNTER — Encounter: Payer: Self-pay | Admitting: Cardiology

## 2012-12-03 DIAGNOSIS — I219 Acute myocardial infarction, unspecified: Secondary | ICD-10-CM | POA: Insufficient documentation

## 2012-12-03 DIAGNOSIS — Z79899 Other long term (current) drug therapy: Secondary | ICD-10-CM | POA: Insufficient documentation

## 2012-12-03 NOTE — Assessment & Plan Note (Signed)
No further episodes. He is monitoring his intake and output to ensure that he is dehydrated. He is also paying attention to what he does when he first gets up in the bathroom. Hopefully having backed off on his antihypertensive regimen will reduce the likelihood of another event.

## 2012-12-03 NOTE — Assessment & Plan Note (Addendum)
Continue on DAPT. Not on a statin D2 intolerance with muscle cramping and memory issues.

## 2012-12-03 NOTE — Assessment & Plan Note (Addendum)
He is due for a lipid profile and chemistries today -- is most recent labs are much improved with total cholesterol 168, HDL of 41 LDL 107 and triglycerides 99. Unfortunately that was when he was on good statin therapy.. He has not eaten so to get labs done. Based on these results will determine what we need to. I think we should try Lovaza.

## 2012-12-03 NOTE — Assessment & Plan Note (Signed)
No recurrent anginal symptoms, well-preserved EF with no evidence of scar on Myoview. No wall motion abnormalities on echo. On stable medical regimen.  He did well in cardiac rehabilitation, has continued to exercise, albeit less than he had before.

## 2012-12-03 NOTE — Assessment & Plan Note (Addendum)
Status post two-vessel PCI. Stable without any active cardiac symptoms. On beta blocker as well as aspirin and Effient for dual antiplatelet therapy (DAPT). ACE inhibitor was discontinued after his syncopal event.

## 2012-12-03 NOTE — Assessment & Plan Note (Signed)
Due for full lipid panel and LFTs/CMP following several months off of statin therapy. We'll need to determine the next agent to use.

## 2012-12-03 NOTE — Assessment & Plan Note (Signed)
We actually stopped his ACE inhibitor to allow for more blood pressure after his syncopal event. The blood pressure is well-controlled today simply on beta blocker.

## 2012-12-03 NOTE — Assessment & Plan Note (Signed)
He had done so well the patient is a weight loss, now is gaining back some weight. His last weight recorded was about 220 pounds. He is now 232. He says he still try to do well eating but this has not been exercising much due to too much stress and time required for work. He also is limited due to the heat in the summer time.

## 2013-01-02 ENCOUNTER — Other Ambulatory Visit: Payer: Self-pay | Admitting: Cardiology

## 2013-01-02 NOTE — Telephone Encounter (Signed)
Rx was sent to pharmacy electronically. 

## 2013-01-11 ENCOUNTER — Other Ambulatory Visit: Payer: Self-pay

## 2013-01-20 ENCOUNTER — Encounter (HOSPITAL_COMMUNITY): Payer: Self-pay | Admitting: Emergency Medicine

## 2013-01-20 LAB — CBC
HCT: 40.5 % (ref 39.0–52.0)
MCHC: 34.3 g/dL (ref 30.0–36.0)
Platelets: 236 10*3/uL (ref 150–400)
RDW: 13.3 % (ref 11.5–15.5)

## 2013-01-20 LAB — POCT I-STAT TROPONIN I: Troponin i, poc: 0 ng/mL (ref 0.00–0.08)

## 2013-01-20 NOTE — ED Notes (Signed)
The pt is c/o mid-chest pain all day.  He took nitro and the pain went away sl nausea dizziness.  No pain now

## 2013-01-21 ENCOUNTER — Observation Stay (HOSPITAL_COMMUNITY)
Admission: EM | Admit: 2013-01-21 | Discharge: 2013-01-21 | DRG: 313 | Disposition: A | Discharge status: Home / Self Care | Payer: BC Managed Care – PPO | Attending: Internal Medicine | Admitting: Internal Medicine

## 2013-01-21 ENCOUNTER — Emergency Department (HOSPITAL_COMMUNITY): Payer: BC Managed Care – PPO

## 2013-01-21 ENCOUNTER — Encounter (HOSPITAL_COMMUNITY): Payer: Self-pay | Admitting: *Deleted

## 2013-01-21 DIAGNOSIS — E8881 Metabolic syndrome: Secondary | ICD-10-CM

## 2013-01-21 DIAGNOSIS — Z9861 Coronary angioplasty status: Secondary | ICD-10-CM

## 2013-01-21 DIAGNOSIS — I2510 Atherosclerotic heart disease of native coronary artery without angina pectoris: Secondary | ICD-10-CM

## 2013-01-21 DIAGNOSIS — I10 Essential (primary) hypertension: Secondary | ICD-10-CM

## 2013-01-21 DIAGNOSIS — R55 Syncope and collapse: Secondary | ICD-10-CM

## 2013-01-21 DIAGNOSIS — E785 Hyperlipidemia, unspecified: Secondary | ICD-10-CM

## 2013-01-21 DIAGNOSIS — Z79899 Other long term (current) drug therapy: Secondary | ICD-10-CM

## 2013-01-21 DIAGNOSIS — I200 Unstable angina: Secondary | ICD-10-CM

## 2013-01-21 DIAGNOSIS — I219 Acute myocardial infarction, unspecified: Secondary | ICD-10-CM

## 2013-01-21 DIAGNOSIS — M549 Dorsalgia, unspecified: Secondary | ICD-10-CM | POA: Diagnosis present

## 2013-01-21 DIAGNOSIS — E669 Obesity, unspecified: Secondary | ICD-10-CM

## 2013-01-21 DIAGNOSIS — Z8249 Family history of ischemic heart disease and other diseases of the circulatory system: Secondary | ICD-10-CM

## 2013-01-21 DIAGNOSIS — R0789 Other chest pain: Secondary | ICD-10-CM | POA: Diagnosis present

## 2013-01-21 DIAGNOSIS — I2119 ST elevation (STEMI) myocardial infarction involving other coronary artery of inferior wall: Secondary | ICD-10-CM

## 2013-01-21 LAB — TROPONIN I
Troponin I: <0.3 ng/mL (ref <0.30)
Troponin I: <0.3 ng/mL (ref <0.30)

## 2013-01-21 LAB — BASIC METABOLIC PANEL
BUN: 17 mg/dL (ref 6–23)
Creatinine, Ser: 0.91 mg/dL (ref 0.50–1.35)
GFR calc Af Amer: >90 mL/min (ref >90)
GFR calc non Af Amer: >90 mL/min (ref >90)
Potassium: 3.8 mEq/L (ref 3.5–5.1)

## 2013-01-21 LAB — D-DIMER, QUANTITATIVE: D-Dimer, Quant: 0.28 ug/mL-FEU (ref 0.00–0.48)

## 2013-01-21 LAB — MRSA PCR SCREENING: MRSA by PCR: NEGATIVE

## 2013-01-21 MED ORDER — ONDANSETRON HCL 4 MG/2ML IJ SOLN: 4.0000 mg | Freq: Four times a day (QID) | INTRAMUSCULAR | Status: DC | PRN

## 2013-01-21 MED ORDER — HEPARIN SODIUM (PORCINE) 5000 UNIT/ML IJ SOLN
5000.0000 [IU] | Freq: Three times a day (TID) | INTRAMUSCULAR | Status: DC
  Administered 2013-01-21 (×2): 5000 [IU] via SUBCUTANEOUS
  Filled 2013-01-21 (×4): qty 1

## 2013-01-21 MED ORDER — CARVEDILOL 6.25 MG PO TABS
6.2500 mg | ORAL_TABLET | Freq: Two times a day (BID) | ORAL | Status: DC
  Filled 2013-01-21 (×2): qty 1

## 2013-01-21 MED ORDER — ASPIRIN 81 MG PO CHEW
81.0000 mg | CHEWABLE_TABLET | Freq: Every day | ORAL | Status: DC
  Administered 2013-01-21: 81 mg via ORAL
  Filled 2013-01-21: qty 1

## 2013-01-21 MED ORDER — FENOFIBRATE 54 MG PO TABS
54.0000 mg | ORAL_TABLET | Freq: Every day | ORAL | Status: DC
  Administered 2013-01-21: 54 mg via ORAL
  Filled 2013-01-21: qty 1

## 2013-01-21 MED ORDER — CARVEDILOL 6.25 MG PO TABS
6.2500 mg | ORAL_TABLET | Freq: Two times a day (BID) | ORAL | Status: DC
  Administered 2013-01-21 (×2): 6.25 mg via ORAL
  Filled 2013-01-21 (×3): qty 1

## 2013-01-21 MED ORDER — ACETAMINOPHEN 325 MG PO TABS: 650.0000 mg | ORAL_TABLET | ORAL | Status: DC | PRN

## 2013-01-21 MED ORDER — ONDANSETRON HCL 4 MG/2ML IJ SOLN
4.0000 mg | Freq: Once | INTRAMUSCULAR | Status: AC
  Administered 2013-01-21: 4 mg via INTRAVENOUS
  Filled 2013-01-21: qty 2

## 2013-01-21 MED ORDER — MORPHINE SULFATE 4 MG/ML IJ SOLN
4.0000 mg | Freq: Once | INTRAMUSCULAR | Status: AC
  Administered 2013-01-21: 4 mg via INTRAVENOUS
  Filled 2013-01-21: qty 1

## 2013-01-21 MED ORDER — NITROGLYCERIN 0.4 MG SL SUBL: 0.4000 mg | SUBLINGUAL_TABLET | SUBLINGUAL | Status: DC | PRN

## 2013-01-21 MED ORDER — ASPIRIN 81 MG PO CHEW
324.0000 mg | CHEWABLE_TABLET | Freq: Once | ORAL | Status: AC
  Administered 2013-01-21: 324 mg via ORAL
  Filled 2013-01-21: qty 4

## 2013-01-21 MED ORDER — DIAZEPAM 5 MG PO TABS
5.0000 mg | ORAL_TABLET | Freq: Once | ORAL | Status: AC
  Administered 2013-01-21: 5 mg via ORAL
  Filled 2013-01-21: qty 1

## 2013-01-21 MED ORDER — METHOCARBAMOL 500 MG PO TABS: 500.0000 mg | ORAL_TABLET | Freq: Three times a day (TID) | ORAL | Status: DC | PRN

## 2013-01-21 MED ORDER — PRASUGREL HCL 10 MG PO TABS
10.0000 mg | ORAL_TABLET | Freq: Every day | ORAL | Status: DC
  Administered 2013-01-21: 10 mg via ORAL
  Filled 2013-01-21: qty 1

## 2013-01-21 NOTE — ED Notes (Signed)
Pt taken to xray via stretcher  

## 2013-01-21 NOTE — Progress Notes (Signed)
[facility] PROGRESS NOTE  Jason Mathis ZOX:[id number] DOB: Dec 03, 1955 DOA: 01/21/2013 PCP: [name], MD I have seen and examined pt who is a 57yo admitted this am by Dr Julian Reil with recent h/o CAD, with 2 stents done last year to his RCA and LAD in succession who presented with complaints of chest pain that seemed to be radiating from his back. Patient was seen in the ED and EKG admitting M.D. showed ST depression and cardiology was consulted. Patient states he still has back pain but not as intense and not having chest pain at this time. Cardiac enzymes so far negative, will obtain echo given his history, cardiology to follow up for further recommendations. We'll continue current management plan as per Dr. Julian Reil in followup on echo, and cardiology recs.     [name]  [facility] Pager [phone]. If 7PM-7AM, please contact night-coverage at [email or web address], password [facility] 01/21/2013, 9:43 AM  LOS: 0 days

## 2013-01-21 NOTE — H&P (Signed)
[facility] History and Physical  Jason Mathis UJW:[id number] DOB: August 24, 1955 DOA: 01/21/2013  Referring physician: ED PCP: [name], MD  Chief Complaint: Chest pain  HPI: Jason Mathis is a 57 y.o. male who presents to the ED with c/o chest pain.  Patient has had ongoing back pain, and sometimes gets chest pain with this.  Tonight at home he developed substernal CP.  Symptoms did not resolve with NTG.  He noted that his BP and HR were elevated, this made him concerned and so he presented to the ED.  The patient has a recent h/o CAD, with 2 stents done last year to his RCA and LAD in succession.  More concerning are his EKG changes tonight as discussed below.  Review of Systems: 12 systems reviewed and otherwise negative.  Past Medical History  Diagnosis Date  . History of ST elevation myocardial infarction (STEMI) of inferior wall 07/15/2011    inferior; PCI of Proximal RCA & LAD  . CAD (coronary artery disease), native coronary artery 07/2011    Additional ~70-80% LAD lesion; Preserved LVEF  . History of nuclear stress test 08/24/2011    bruce protocol; mild ischemia in apical lateral regions; mild perfusion defect due to infarct/scar with mod-severe perinfarct ischemia in basal inferoseptal/basal inferior/mid inferoseptal/mid inferior regions; high risk    . CAD S/P percutaneous coronary angioplasty 09/07/2011    PCI of the LAD - Promus Element DES 4.0x3mm postdilated to 4.12mm  . H/O echocardiogram 07/15/2011    Normal EF 55-60%; no WMA  . Hypertension   . Dyslipidemia, goal LDL below 70 07/15/2011  . Metabolic syndrome     Hgb AiC 6.1 -- low HDL, elevated blood glucose, hypertension  . Obesity (BMI 30-39.9)   . Kidney stones   . Chronic bronchitis     "used to get it every year til a few years ago" (09/07/11)  . History of pneumonia 2003  . Syncope and collapse April 2014    Post-micturition with orthostatic tendencies.   Past Surgical History  Procedure Laterality Date   . Coronary angioplasty with stent placement  07/2011    PCI of RCA with Promus Element 3.0 mm x 16 mm DES; final diameter 3.25 mm  . Coronary angioplasty with stent placement  09/07/2011    PCI of LAD with Promus Element DES 4.0x63mm postdilated to 4.33mm  . Tonsillectomy  1968  . Lithotripsy  1999  . Anal sphincterotomy  1996  . Knee arthroscopy  1999; 9/20099    r/t left knee meniscal tear  . Transthoracic echocardiogram  07/15/2011    EF 55-60%   Social History:  reports that he has never smoked. He quit smokeless tobacco use about 9 years ago. His smokeless tobacco use included Chew. He reports that he drinks about 4.2 ounces of alcohol per week. He reports that he does not use illicit drugs.   Allergies  Allergen Reactions  . Atorvastatin     Memory issues  . Crestor [Rosuvastatin]     Memory issues   . Zetia [Ezetimibe]     Myalgias     Family History  Problem Relation Age of Onset  . Coronary artery disease Father 52    MI at 51y/o - died  . Hyperlipidemia Mother   . Heart Problems Maternal Grandmother   . Pneumonia Maternal Grandfather     Prior to Admission medications   Medication Sig Start Date End Date Taking? Authorizing Provider  aspirin 81 MG chewable tablet Chew 81 mg by  mouth daily.   Yes Abelino Derrick, PA-C  carvedilol (COREG) 6.25 MG tablet Take 6.25 mg by mouth 2 (two) times daily with a meal.   Yes Abelino Derrick, PA-C  EFFIENT 10 MG TABS tablet TAKE 1 TABLET BY MOUTH EVERY DAY 01/02/13  Yes [name], MD  fenofibrate (TRICOR) 48 MG tablet Take 1 tablet (48 mg total) by mouth daily. 11/29/12  Yes [name], MD  NAPROXEN PO Take 1-3 tablets by mouth every 12 (twelve) hours as needed (back pain).   Yes Historical Provider, MD  nitroGLYCERIN (NITROSTAT) 0.4 MG SL tablet Place 1 tablet (0.4 mg total) under the tongue every 5 (five) minutes as needed. For chest pain 10/09/12  Yes Chrystie Nose, MD  oxymetazoline (AFRIN) 0.05 % nasal spray Place 1  spray into both nostrils 2 (two) times daily as needed for congestion.    Historical Provider, MD   Physical Exam: Filed Vitals:   01/20/13 2336  BP: 145/94  Pulse: 105  Temp: 98 F (36.7 C)  Resp: 20    General:  NAD, resting comfortably in bed Eyes: PEERLA EOMI ENT: mucous membranes moist Neck: supple w/o JVD Cardiovascular: RRR w/o MRG Respiratory: CTA B Abdomen: soft, nt, nd, bs+ Skin: no rash nor lesion Musculoskeletal: MAE, full ROM all 4 extremities Psychiatric: normal tone and affect Neurologic: AAOx3, grossly non-focal  Labs on Admission:  Basic Metabolic Panel:  Recent Labs Lab 01/20/13 2340  NA 137  K 3.8  CL 103  CO2 25  GLUCOSE 123*  BUN 17  CREATININE 0.91  CALCIUM 8.8   Liver Function Tests: No results found for this basename: AST, ALT, ALKPHOS, BILITOT, PROT, ALBUMIN,  in the last 168 hours No results found for this basename: LIPASE, AMYLASE,  in the last 168 hours No results found for this basename: AMMONIA,  in the last 168 hours CBC:  Recent Labs Lab 01/20/13 2340  WBC 6.0  HGB 13.9  HCT 40.5  MCV 90.2  PLT 236   Cardiac Enzymes: No results found for this basename: CKTOTAL, CKMB, CKMBINDEX, TROPONINI,  in the last 168 hours  BNP (last 3 results) No results found for this basename: PROBNP,  in the last 8760 hours CBG: No results found for this basename: GLUCAP,  in the last 168 hours  Radiological Exams on Admission: Dg Chest 2 View  01/21/2013   CLINICAL DATA:  Chest pain, history of heart attack and stent placement.  EXAM: CHEST  2 VIEW  COMPARISON:  Chest radiograph Jul 08, 2005  FINDINGS: Cardiomediastinal silhouette is nonsuspicious, Coronary artery stents noted. The lungs are clear without pleural effusions or focal consolidations. Pulmonary vasculature is unremarkable. Trachea projects midline and there is no pneumothorax. Soft tissue planes and included osseous structures are nonsuspicious.  IMPRESSION: No acute cardiopulmonary  process.   Electronically Signed   By: Awilda Metro   On: 01/21/2013 00:34    EKG: Independently reviewed. New ST segment depressions and T wave inversions in the anterio-lateral leads (V3-V6), this does NOT appear to have been present on the comparison EKG on 11/27/12.  The ST and T wave abnormalities in the inferior leads by contrast appear old likely from his inferior wall STEMI last year.  Assessment/Plan Principal Problem:   Unstable angina Active Problems:   CAD S/P percutaneous coronary angioplasty: PCI RCA during STEMI, staged PCI to LAD after positive stress test one month post stenting   1. Unstable angina - While the patients story  of symptoms is only somewhat worrisome, his history puts him at high risk and his EKG does appear to show new ST depressions and t wave inversions in leads V3-V6.  His HEART score is 6 out of 10.  Have spoken with cardiology on call as discussed below.  Admitting patient to SDU, serial enzymes, will require either stress test or possibly even heart cath.  NPO.  Spoke with Dr. Donnetta Simpers who is on call for cardiology, he will evaluate patient and make decision regarding heparin.  Also to make decision on stress test vs cath.  Code Status: Full Code (must indicate code status--if unknown or must be presumed, indicate so) Family Communication: Wife at bedside (indicate person spoken with, if applicable, with phone number if by telephone) Disposition Plan: Admit to SDU (indicate anticipated LOS)  Time spent: 70 min  GARDNER, JARED M. [facility] Pager [phone]  If 7PM-7AM, please contact night-coverage [email or web address] Password [email or web address] 01/21/2013, 1:58 AM

## 2013-01-21 NOTE — ED Provider Notes (Signed)
CSN: [id number]     Arrival date & time 01/20/13  2313 History   First MD Initiated Contact with Patient 01/21/13 0015     Chief Complaint  Patient presents with  . Chest Pain   (Consider location/radiation/quality/duration/timing/severity/associated sxs/prior Treatment) HPI Hx per PT - has ongoing mid back pain unchanged, sometimes gets associated CP with this. Tonight at home developed substernal CP.  He took a NTG with no sig change. He noted his BP and HR were elevated which made him more concerned and he presents here. Pain is mild to mod in severity. No N/V. No leg pain or swelling. No known alleviating factors. He is requesting a muscle relaxer.   Followed by [facility] for h/o MI and stents. Took baby ASA this am.   Past Medical History  Diagnosis Date  . History of ST elevation myocardial infarction (STEMI) of inferior wall 07/15/2011    inferior; PCI of Proximal RCA & LAD  . CAD (coronary artery disease), native coronary artery 07/2011    Additional ~70-80% LAD lesion; Preserved LVEF  . History of nuclear stress test 08/24/2011    bruce protocol; mild ischemia in apical lateral regions; mild perfusion defect due to infarct/scar with mod-severe perinfarct ischemia in basal inferoseptal/basal inferior/mid inferoseptal/mid inferior regions; high risk    . CAD S/P percutaneous coronary angioplasty 09/07/2011    PCI of the LAD - Promus Element DES 4.0x67mm postdilated to 4.77mm  . H/O echocardiogram 07/15/2011    Normal EF 55-60%; no WMA  . Hypertension   . Dyslipidemia, goal LDL below 70 07/15/2011  . Metabolic syndrome     Hgb AiC 6.1 -- low HDL, elevated blood glucose, hypertension  . Obesity (BMI 30-39.9)   . Kidney stones   . Chronic bronchitis     "used to get it every year til a few years ago" (09/07/11)  . History of pneumonia 2003  . Syncope and collapse April 2014    Post-micturition with orthostatic tendencies.   Past Surgical History  Procedure Laterality Date  . Coronary  angioplasty with stent placement  07/2011    PCI of RCA with Promus Element 3.0 mm x 16 mm DES; final diameter 3.25 mm  . Coronary angioplasty with stent placement  09/07/2011    PCI of LAD with Promus Element DES 4.0x82mm postdilated to 4.50mm  . Tonsillectomy  1968  . Lithotripsy  1999  . Anal sphincterotomy  1996  . Knee arthroscopy  1999; 9/20099    r/t left knee meniscal tear  . Transthoracic echocardiogram  07/15/2011    EF 55-60%   Family History  Problem Relation Age of Onset  . Coronary artery disease Father 64    MI at 51y/o - died  . Hyperlipidemia Mother   . Heart Problems Maternal Grandmother   . Pneumonia Maternal Grandfather    History  Substance Use Topics  . Smoking status: Never Smoker   . Smokeless tobacco: Former Neurosurgeon    Types: Chew    Quit date: 03/09/2003  . Alcohol Use: 4.2 oz/week    7 Glasses of wine per week     Comment: 11/28/2012-occasional glass of wine, 09/07/11 "nothing since MI 07/14/11"    Review of Systems  Constitutional: Negative for fever and chills.  Eyes: Negative for pain.  Respiratory: Negative for shortness of breath.   Cardiovascular: Positive for chest pain.  Gastrointestinal: Negative for abdominal pain.  Genitourinary: Negative for dysuria.  Musculoskeletal: Positive for back pain. Negative for neck pain and neck  stiffness.  Skin: Negative for rash.  Neurological: Negative for headaches.  All other systems reviewed and are negative.    Allergies  Atorvastatin; Crestor; and Zetia  Home Medications   Current Outpatient Rx  Name  Route  Sig  Dispense  Refill  . aspirin 81 MG chewable tablet   Oral   Chew 81 mg by mouth daily.         . carvedilol (COREG) 6.25 MG tablet   Oral   Take 6.25 mg by mouth 2 (two) times daily with a meal.         . EFFIENT 10 MG TABS tablet      TAKE 1 TABLET BY MOUTH EVERY DAY   90 tablet   3   . fenofibrate (TRICOR) 48 MG tablet   Oral   Take 1 tablet (48 mg total) by mouth daily.    30 tablet   6   . nitroGLYCERIN (NITROSTAT) 0.4 MG SL tablet   Sublingual   Place 1 tablet (0.4 mg total) under the tongue every 5 (five) minutes as needed. For chest pain   25 tablet   9    BP 145/94  Pulse 105  Temp(Src) 98 F (36.7 C) (Oral)  Resp 20  Wt 242 lb 3 oz (109.856 kg)  SpO2 96% Physical Exam  Constitutional: He is oriented to person, place, and time. He appears well-developed and well-nourished.  HENT:  Head: Normocephalic and atraumatic.  Eyes: EOM are normal. Pupils are equal, round, and reactive to light.  Neck: Neck supple.  Cardiovascular: Regular rhythm and intact distal pulses.   tachycardic  Pulmonary/Chest: Effort normal and breath sounds normal. No respiratory distress. He exhibits no tenderness.  Abdominal: Soft. Bowel sounds are normal. He exhibits no distension. There is no tenderness.  Musculoskeletal: Normal range of motion. He exhibits no edema.  Some left upper thoracic tenderness, no midline tenderness or deformity  Neurological: He is alert and oriented to person, place, and time.  Skin: Skin is warm and dry.     ED Course  Procedures (including critical care time) Results for orders placed during the hospital encounter of 01/21/13  CBC      Result Value Range   WBC 6.0  4.0 - 10.5 K/uL   RBC 4.49  4.22 - 5.81 MIL/uL   Hemoglobin 13.9  13.0 - 17.0 g/dL   HCT 16.1  09.6 - 04.5 %   MCV 90.2  78.0 - 100.0 fL   MCH 31.0  26.0 - 34.0 pg   MCHC 34.3  30.0 - 36.0 g/dL   RDW 40.9  81.1 - 91.4 %   Platelets 236  150 - 400 K/uL  BASIC METABOLIC PANEL      Result Value Range   Sodium 137  135 - 145 mEq/L   Potassium 3.8  3.5 - 5.1 mEq/L   Chloride 103  96 - 112 mEq/L   CO2 25  19 - 32 mEq/L   Glucose, Bld 123 (*) 70 - 99 mg/dL   BUN 17  6 - 23 mg/dL   Creatinine, Ser 7.82  0.50 - 1.35 mg/dL   Calcium 8.8  8.4 - 95.6 mg/dL   GFR calc non Af Amer >90  >90 mL/min   GFR calc Af Amer >90  >90 mL/min  POCT I-STAT TROPONIN I      Result Value  Range   Troponin i, poc 0.00  0.00 - 0.08 ng/mL   Comment 3  Dg Chest 2 View  01/21/2013   CLINICAL DATA:  Chest pain, history of heart attack and stent placement.  EXAM: CHEST  2 VIEW  COMPARISON:  Chest radiograph Jul 08, 2005  FINDINGS: Cardiomediastinal silhouette is nonsuspicious, Coronary artery stents noted. The lungs are clear without pleural effusions or focal consolidations. Pulmonary vasculature is unremarkable. Trachea projects midline and there is no pneumothorax. Soft tissue planes and included osseous structures are nonsuspicious.  IMPRESSION: No acute cardiopulmonary process.   Electronically Signed   By: Awilda Metro   On: 01/21/2013 00:34     Date: 01/21/2013  Rate: 110  Rhythm: sinus tachycardia  QRS Axis: normal  Intervals: normal  ST/T Wave abnormalities: nonspecific ST/T changes  Conduction Disutrbances:none  Narrative Interpretation:   Old EKG Reviewed: changes noted new lateral ST depressions versus prior ECG  NTG PTA ASA, IV Morphine, valium  1:23 AM d/w CAR Dr Jomarie Longs, recs MED admit DR Julian Reil to admit MDM  DX: Chest Pain  ECG with new changes Medications provided CXR and labs reviewed CAR consult MED admit    Sunnie Nielsen, MD 01/21/13 0210

## 2013-01-21 NOTE — Consult Note (Signed)
Cardiology Consultation Note  Patient ID: Wake Conlee, MRN: [id number], DOB/AGE: Oct 21, 1955 57 y.o. Admit date: 01/21/2013   Date of Consult: 01/21/2013 Primary Physician: [name], MD Primary Cardiologist:  Assessment and Plan:  Ass Precordial Chest pain- atypical,  CAD   HTN  HLD  Plan  Will repeat EKG and Trop I . While symptoms atypical , will consider further workup if abnormal.  Hold on Anticoagulation  Cont aspirin  Pt reports allergy to statin.  BP controlled currently     57 yr old male with hx 2 vessel CAD ( S/p LAD and RCA pci in 2013 ) , HTN , HLD , metabolic syndrome, nl EF ( per Echo 07/2011) here for chest pain. HPI pt states that he has been having back pain at T6 level that radiates sometimes to the center of the chest for the past several month. This get better when he walk( does upto 3.5 miles per hour) 3 times per week. However last night the pain was worse and when he checked his BP it was SBP 149 and HR in the 120's . This worried him and he came to the ER. EKG shows NSR with 0.5-1 mm ST depression in V4 - V6. Pt denies any SOB , nausea , diaphorosis , orthopnea, PND , LE edema, syncope , focal weakness, bleeding etc.   ROS per HPI    Past Medical History  Diagnosis Date  . History of ST elevation myocardial infarction (STEMI) of inferior wall 07/15/2011    inferior; PCI of Proximal RCA & LAD  . CAD (coronary artery disease), native coronary artery 07/2011    Additional ~70-80% LAD lesion; Preserved LVEF  . History of nuclear stress test 08/24/2011    bruce protocol; mild ischemia in apical lateral regions; mild perfusion defect due to infarct/scar with mod-severe perinfarct ischemia in basal inferoseptal/basal inferior/mid inferoseptal/mid inferior regions; high risk    . CAD S/P percutaneous coronary angioplasty 09/07/2011    PCI of the LAD - Promus Element DES 4.0x74mm postdilated to 4.65mm  . H/O echocardiogram 07/15/2011    Normal EF 55-60%; no  WMA  . Hypertension   . Dyslipidemia, goal LDL below 70 07/15/2011  . Metabolic syndrome     Hgb AiC 6.1 -- low HDL, elevated blood glucose, hypertension  . Obesity (BMI 30-39.9)   . Kidney stones   . Chronic bronchitis     "used to get it every year til a few years ago" (09/07/11)  . History of pneumonia 2003  . Syncope and collapse April 2014    Post-micturition with orthostatic tendencies.      Most Recent Cardiac Studies:    Surgical History:  Past Surgical History  Procedure Laterality Date  . Coronary angioplasty with stent placement  07/2011    PCI of RCA with Promus Element 3.0 mm x 16 mm DES; final diameter 3.25 mm  . Coronary angioplasty with stent placement  09/07/2011    PCI of LAD with Promus Element DES 4.0x65mm postdilated to 4.30mm  . Tonsillectomy  1968  . Lithotripsy  1999  . Anal sphincterotomy  1996  . Knee arthroscopy  1999; 9/20099    r/t left knee meniscal tear  . Transthoracic echocardiogram  07/15/2011    EF 55-60%     Home Meds: Prior to Admission medications   Medication Sig Start Date End Date Taking? Authorizing Provider  aspirin 81 MG chewable tablet Chew 81 mg by mouth daily.   Yes Abelino Derrick, PA-C  carvedilol (COREG) 6.25 MG tablet Take 6.25 mg by mouth 2 (two) times daily with a meal.   Yes Abelino Derrick, PA-C  EFFIENT 10 MG TABS tablet TAKE 1 TABLET BY MOUTH EVERY DAY 01/02/13  Yes [name], MD  fenofibrate (TRICOR) 48 MG tablet Take 1 tablet (48 mg total) by mouth daily. 11/29/12  Yes [name], MD  NAPROXEN PO Take 1-3 tablets by mouth every 12 (twelve) hours as needed (back pain).   Yes Historical Provider, MD  nitroGLYCERIN (NITROSTAT) 0.4 MG SL tablet Place 1 tablet (0.4 mg total) under the tongue every 5 (five) minutes as needed. For chest pain 10/09/12  Yes Chrystie Nose, MD  oxymetazoline (AFRIN) 0.05 % nasal spray Place 1 spray into both nostrils 2 (two) times daily as needed for congestion.    Historical Provider, MD     Inpatient Medications:  . heparin  5,000 Units Subcutaneous Q8H      Allergies:  Allergies  Allergen Reactions  . Atorvastatin     Memory issues  . Crestor [Rosuvastatin]     Memory issues   . Zetia [Ezetimibe]     Myalgias     History   Social History  . Marital Status: Married    Spouse Name: N/A    Number of Children: 2  . Years of Education: PhD.    Occupational History  .  [employer]   Social History Main Topics  . Smoking status: Never Smoker   . Smokeless tobacco: Former Neurosurgeon    Types: Chew    Quit date: 03/09/2003  . Alcohol Use: 4.2 oz/week    7 Glasses of wine per week     Comment: 11/28/2012-occasional glass of wine, 09/07/11 "nothing since MI 07/14/11"  . Drug Use: No  . Sexual Activity: Yes   Other Topics Concern  . Not on file   Social History Narrative   Married, father of 2. He is a Scientist, research (physical sciences). He exercises routinely about 3 days a week for about 60 minutes a day. Does not smoke does not drink. He has although the weight due to less frequent exercise and dietary modification.     Family History  Problem Relation Age of Onset  . Coronary artery disease Father 37    MI at 51y/o - died  . Hyperlipidemia Mother   . Heart Problems Maternal Grandmother   . Pneumonia Maternal Grandfather      Review of Systems:per HPI   Labs:  Recent Labs  01/21/13 0210  TROPONINI <0.30   Lab Results  Component Value Date   WBC 6.0 01/20/2013   HGB 13.9 01/20/2013   HCT 40.5 01/20/2013   MCV 90.2 01/20/2013   PLT 236 01/20/2013    Recent Labs Lab 01/20/13 2340  NA 137  K 3.8  CL 103  CO2 25  BUN 17  CREATININE 0.91  CALCIUM 8.8  GLUCOSE 123*   Lab Results  Component Value Date   CHOL 240* 11/28/2012   HDL 42 11/28/2012   LDLCALC 165* 11/28/2012   TRIG 167* 11/28/2012   Lab Results  Component Value Date   DDIMER 0.28 01/21/2013    Radiology/Studies:  Dg Chest 2 View  01/21/2013   CLINICAL DATA:  Chest pain, history of  heart attack and stent placement.  EXAM: CHEST  2 VIEW  COMPARISON:  Chest radiograph Jul 08, 2005  FINDINGS: Cardiomediastinal silhouette is nonsuspicious, Coronary artery stents noted. The lungs are clear without pleural effusions  or focal consolidations. Pulmonary vasculature is unremarkable. Trachea projects midline and there is no pneumothorax. Soft tissue planes and included osseous structures are nonsuspicious.  IMPRESSION: No acute cardiopulmonary process.   Electronically Signed   By: Awilda Metro   On: 01/21/2013 00:34    EKG: per HPI   Physical Exam: Blood pressure 120/93, pulse 90, temperature 98.2 F (36.8 C), temperature source Oral, resp. rate 20, height 6\' 2"  (1.88 m), weight 108.2 kg (238 lb 8.6 oz), SpO2 97.00%. General: Well developed, well nourished, in no acute distress. Head: Normocephalic, atraumatic, sclera non-icteric, no xanthomas, nares are without discharge.  Neck: Negative for carotid bruits. JVD not elevated. Lungs: Clear bilaterally to auscultation without wheezes, rales, or rhonchi. Breathing is unlabored. Heart: RRR with S1 S2. No murmurs, rubs, or gallops appreciated. Abdomen: Soft, non-tender, non-distended with normoactive bowel sounds. No hepatomegaly. No rebound/guarding. No obvious abdominal masses. Msk:  Strength and tone appear normal for age. Extremities: No clubbing or cyanosis. No edema.  Distal pedal pulses are 2+ and equal bilaterally. Neuro: Alert and oriented X 3. No facial asymmetry. No focal deficit. Moves all extremities spontaneously. Psych:  Responds to questions appropriately with a normal affect.       Lovina Reach, A M.D  01/21/2013, 5:35 AM

## 2013-01-21 NOTE — Plan of Care (Signed)
Problem: Phase I Progression Outcomes Goal: Hemodynamically stable Outcome: Progressing VSS at this time Goal: Anginal pain relieved Outcome: Progressing Pt denies chest pain at this time Goal: Aspirin unless contraindicated Outcome: Progressing Pt recived 324 Asprin in ED Goal: MD aware of Cardiac Marker results Outcome: Progressing First set of Cardiac markers were negative Goal: Voiding-avoid urinary catheter unless indicated Outcome: Progressing Pt voiding

## 2013-01-21 NOTE — ED Notes (Signed)
Report given to rn. Pt transported via stretcher to step down.

## 2013-01-21 NOTE — Discharge Summary (Signed)
Physician Discharge Summary  Calvyn Kurtzman ZOX:[id number] DOB: [birth year] DOA: 01/21/2013  PCP: [name], MD  Admit date: 01/21/2013 Discharge date: 01/21/2013  Time spent: 30 minutes  Recommendations for Outpatient Follow-up:  Follow-up Information   Follow up with [name], MD. (in 1-2weeks, cll for appt upon discharge)    Specialty:  Family Medicine   Contact information:   [address] [place] [address] [phone]       Follow up with [name], MD. (/Dr Hilty as directed- office to call)    Specialty:  Cardiology   Contact information:   [address] [place] [address] [phone]        Discharge Diagnoses:  Principal Problem:   Chest pain, musculoskeletal Active Problems:   CAD S/P percutaneous coronary angioplasty: PCI RCA during STEMI, staged PCI to LAD after positive stress test one month post stenting   Back pain with radiation   Discharge Condition: stable  Diet recommendation:low sodium heart healthy  Filed Weights   01/20/13 2336 01/21/13 0315  Weight: 109.856 kg (242 lb 3 oz) 108.2 kg (238 lb 8.6 oz)    History of present illness:  Jason Mathis is a 57 y.o. male who presents to the ED with c/o chest pain. Patient has had ongoing back pain, and sometimes gets chest pain with this. Tonight at home he developed substernal CP. Symptoms did not resolve with NTG. He noted that his BP and HR were elevated, this made him concerned and so he presented to the ED.  The patient has a recent h/o CAD, with 2 stents done last year to his RCA and LAD in succession. More concerning per admitting MD were the  EKG changes- New ST segment depressions and T wave inversions in the anterio-lateral leads (V3-V6), this does NOT appear to have been present on the comparison EKG on 11/27/12. The ST and T wave abnormalities in the inferior leads by contrast appear old likely from his inferior wall STEMI last year.He was admitted to  [facility] for further eval and management and cards consulted.  Marland Kitchen  Hospital Course:  Chest pain, Atypical Upon admission was pt had EKG as above and was placed on ASA, and cardiology was consulted. On follow up pt did not have any further chest pain though he reported still having some back pain which has been long standing. CEs were cycled and all came back neg. Cards followed up with pt and stated his pain was noncardiac, and recommended discharging on muscle relaxants for musculoskeletal pain. Dr Elissa Hefty office to arrange for outpt followup. Pt is to follow up with PCP for further eval and management of back pain CAD S/P percutaneous coronary angioplasty: PCI RCA during STEMI, staged PCI to LAD after positive stress test one month post stenting  -continue outpt meds -as above, followup with cards oupt Procedures: none Consultations:  cards  Discharge Exam: Filed Vitals:   01/21/13 1511  BP: 111/80  Pulse: 80  Temp: 98.3 F (36.8 C)  Resp: 18   Exam:  General: alert & oriented x 3 In NAD Cardiovascular: RRR, nl S1 s2 Respiratory: CTAB Abdomen: soft +BS NT/ND, no masses palpable Extremities: No cyanosis and no edema    Discharge Instructions  Discharge Orders   Future Orders Complete By Expires   Diet - low sodium heart healthy  As directed    Increase activity slowly  As directed        Medication List    STOP  taking these medications       oxymetazoline 0.05 % nasal spray  Commonly known as:  AFRIN      TAKE these medications       aspirin 81 MG chewable tablet  Chew 81 mg by mouth daily.     carvedilol 6.25 MG tablet  Commonly known as:  COREG  Take 6.25 mg by mouth 2 (two) times daily with a meal.     EFFIENT 10 MG Tabs tablet  Generic drug:  prasugrel  TAKE 1 TABLET BY MOUTH EVERY DAY     fenofibrate 48 MG tablet  Commonly known as:  TRICOR  Take 1 tablet (48 mg total) by mouth daily.     methocarbamol 500 MG tablet  Commonly known as:  ROBAXIN   Take 1 tablet (500 mg total) by mouth 3 (three) times daily as needed for muscle spasms.     NAPROXEN PO  Take 1-3 tablets by mouth every 12 (twelve) hours as needed (back pain).     nitroGLYCERIN 0.4 MG SL tablet  Commonly known as:  NITROSTAT  Place 1 tablet (0.4 mg total) under the tongue every 5 (five) minutes as needed. For chest pain       Allergies  Allergen Reactions  . Atorvastatin     Memory issues  . Crestor [Rosuvastatin]     Memory issues   . Zetia [Ezetimibe]     Myalgias        Follow-up Information   Follow up with [name], MD. (in 1-2weeks, cll for appt upon discharge)    Specialty:  Family Medicine   Contact information:   [address] [place] [address] [phone]       Follow up with [name], MD. (/Dr [facility] as directed- office to call)    Specialty:  Cardiology   Contact information:   [address] [place] [address] [phone]        The results of significant diagnostics from this hospitalization (including imaging, microbiology, ancillary and laboratory) are listed below for reference.    Significant Diagnostic Studies: Dg Chest 2 View  01/21/2013   CLINICAL DATA:  Chest pain, history of heart attack and stent placement.  EXAM: CHEST  2 VIEW  COMPARISON:  Chest radiograph Jul 08, 2005  FINDINGS: Cardiomediastinal silhouette is nonsuspicious, Coronary artery stents noted. The lungs are clear without pleural effusions or focal consolidations. Pulmonary vasculature is unremarkable. Trachea projects midline and there is no pneumothorax. Soft tissue planes and included osseous structures are nonsuspicious.  IMPRESSION: No acute cardiopulmonary process.   Electronically Signed   By: Awilda Metro   On: 01/21/2013 00:34    Microbiology: Recent Results (from the past 240 hour(s))  MRSA PCR SCREENING     Status: None   Collection Time    01/21/13  3:53 AM      Result Value Range Status    MRSA by PCR NEGATIVE  NEGATIVE Final   Comment:            The GeneXpert MRSA Assay (FDA     approved for NASAL specimens     only), is one component of a     comprehensive MRSA colonization     surveillance program. It is not     intended to diagnose MRSA     infection nor to guide or     monitor treatment for     MRSA infections.     Labs: Basic  Metabolic Panel:  Recent Labs Lab 01/20/13 2340  NA 137  K 3.8  CL 103  CO2 25  GLUCOSE 123*  BUN 17  CREATININE 0.91  CALCIUM 8.8   Liver Function Tests: No results found for this basename: AST, ALT, ALKPHOS, BILITOT, PROT, ALBUMIN,  in the last 168 hours No results found for this basename: LIPASE, AMYLASE,  in the last 168 hours No results found for this basename: AMMONIA,  in the last 168 hours CBC:  Recent Labs Lab 01/20/13 2340  WBC 6.0  HGB 13.9  HCT 40.5  MCV 90.2  PLT 236   Cardiac Enzymes:  Recent Labs Lab 01/21/13 0210 01/21/13 0750 01/21/13 1300  TROPONINI <0.30 <0.30 <0.30   BNP: BNP (last 3 results) No results found for this basename: PROBNP,  in the last 8760 hours CBG: No results found for this basename: GLUCAP,  in the last 168 hours     Signed:  [name]  [facility] 01/21/2013, 5:52 PM

## 2013-01-21 NOTE — Progress Notes (Addendum)
Subjective: Still having some back pain which he can exacerbate with stretching his arms up.  Objective: Vital signs in last 24 hours: Temp:  [98 F (36.7 C)-98.7 F (37.1 C)] 98.3 F (36.8 C) (11/16 1511) Pulse Rate:  [76-107] 80 (11/16 1511) Resp:  [15-27] 18 (11/16 1511) BP: (111-147)/(80-96) 111/80 mmHg (11/16 1511) SpO2:  [93 %-98 %] 97 % (11/16 1511) Weight:  [238 lb 8.6 oz (108.2 kg)-242 lb 3 oz (109.856 kg)] 238 lb 8.6 oz (108.2 kg) (11/16 0315) Last BM Date: 01/20/13  Intake/Output from previous day: 11/15 0701 - 11/16 0700 In: -  Out: 300 [Urine:300] Intake/Output this shift:    Medications Current Facility-Administered Medications  Medication Dose Route Frequency Provider Last Rate Last Dose  . acetaminophen (TYLENOL) tablet 650 mg  650 mg Oral Q4H PRN Hillary Bow, DO      . aspirin chewable tablet 81 mg  81 mg Oral Daily [name], MD   81 mg at 01/21/13 1054  . carvedilol (COREG) tablet 6.25 mg  6.25 mg Oral BID WC Anabel Bene, RPH   6.25 mg at 01/21/13 1122  . fenofibrate tablet 54 mg  54 mg Oral Daily Adeline C Viyuoh, MD   54 mg at 01/21/13 1054  . heparin injection 5,000 Units  5,000 Units Subcutaneous Q8H Hillary Bow, DO   5,000 Units at 01/21/13 1441  . nitroGLYCERIN (NITROSTAT) SL tablet 0.4 mg  0.4 mg Sublingual Q5 min PRN [name], MD      . ondansetron (ZOFRAN) injection 4 mg  4 mg Intravenous Q6H PRN Hillary Bow, DO      . prasugrel (EFFIENT) tablet 10 mg  10 mg Oral Daily [name], MD   10 mg at 01/21/13 1054    PE: General appearance: alert, cooperative and no distress Lungs: clear to auscultation bilaterally Heart: regular rate and rhythm, S1, S2 normal, no murmur, click, rub or gallop Extremities: No LEE Pulses: 2+ and symmetric Neurologic: Grossly normal  Lab Results:   Recent Labs  01/20/13 2340  WBC 6.0  HGB 13.9  HCT 40.5  PLT 236   BMET  Recent Labs  01/20/13 2340  NA 137  K 3.8  CL  103  CO2 25  GLUCOSE 123*  BUN 17  CREATININE 0.91  CALCIUM 8.8   PT/INR No results found for this basename: LABPROT, INR,  in the last 72 hours  Assessment/Plan  Principal Problem:   Chest pain, musculoskeletal Active Problems:   CAD S/P percutaneous coronary angioplasty: PCI RCA during STEMI, staged PCI to LAD after positive stress test one month post stenting   Back pain with radiation  Plan:   This is noncardiac pain.   No acute EKG changes. Ruled out for MI so far.  Echo pending but I do not think it is necessary at this time.    BP and HR controlled and stable.   LOS: 0 days    HAGER, BRYAN 01/21/2013 1:36 PM  And evaluated the patient along Mr. Hager. The patient is well-known to me. He is planning with back pain radiating to his front. When questioning him, this is clearly not related to his anginal-type symptoms of which he is well aware. His blood work shows negative cardiac enzymes. No acute EKG changes noted. I agree that we do not need an echocardiogram performed.  He should be fine for discharge for cardiac perspective. Would recommend muscle relaxants to help with muscle spasm  in his back. We'll contact him from my office to arrange followup.  He should continue his current heart medications.  [name], M.D., M.S. [facility] [address] [place], [place]  [address]  [phone] Pager # [phone] 01/21/2013 4:14 PM

## 2013-01-21 NOTE — ED Notes (Signed)
Pt sts he is cp free. md aware.

## 2013-02-06 ENCOUNTER — Other Ambulatory Visit: Payer: Self-pay | Admitting: Cardiology

## 2013-02-06 NOTE — Telephone Encounter (Signed)
Rx was sent to pharmacy electronically. 

## 2013-02-19 ENCOUNTER — Other Ambulatory Visit: Payer: Self-pay | Admitting: *Deleted

## 2013-02-19 DIAGNOSIS — E782 Mixed hyperlipidemia: Secondary | ICD-10-CM

## 2013-02-19 MED ORDER — FENOFIBRATE 48 MG PO TABS: 48.0000 mg | ORAL_TABLET | Freq: Every day | ORAL | Status: DC

## 2013-02-28 ENCOUNTER — Telehealth: Payer: Self-pay | Admitting: *Deleted

## 2013-02-28 ENCOUNTER — Encounter: Payer: Self-pay | Admitting: *Deleted

## 2013-02-28 DIAGNOSIS — Z79899 Other long term (current) drug therapy: Secondary | ICD-10-CM

## 2013-02-28 DIAGNOSIS — E782 Mixed hyperlipidemia: Secondary | ICD-10-CM

## 2013-02-28 NOTE — Telephone Encounter (Signed)
Message copied by Tobin Chad on Wed Feb 28, 2013 10:28 AM ------      Message from: Tobin Chad      Created: Wed Nov 29, 2012  2:11 PM      Regarding: labslip       Spoke to patient . Result  Given. Verbalized understanding. Prescription sent to [facility]. [facility] supply.       labslip will be sent out @1 /2015 for patient to followup.            Liver panel,lipid (l00k at order review note 11/29/12 ------

## 2013-02-28 NOTE — Telephone Encounter (Signed)
Mailed letter - for labs

## 2013-03-21 LAB — HEPATIC FUNCTION PANEL
ALT: 15 U/L (ref 0–53)
AST: 16 U/L (ref 0–37)
Albumin: 4.3 g/dL (ref 3.5–5.2)
Alkaline Phosphatase: 52 U/L (ref 39–117)
Bilirubin, Direct: 0.1 mg/dL (ref 0.0–0.3)
Indirect Bilirubin: 0.6 mg/dL (ref 0.0–0.9)
Total Bilirubin: 0.7 mg/dL (ref 0.3–1.2)
Total Protein: 6.5 g/dL (ref 6.0–8.3)

## 2013-03-21 LAB — LIPID PANEL
Cholesterol: 207 mg/dL — ABNORMAL HIGH (ref 0–200)
HDL: 39 mg/dL — ABNORMAL LOW (ref >39)
LDL Cholesterol: 141 mg/dL — ABNORMAL HIGH (ref 0–99)
Total CHOL/HDL Ratio: 5.3 Ratio
Triglycerides: 136 mg/dL (ref <150)
VLDL: 27 mg/dL (ref 0–40)

## 2013-03-22 ENCOUNTER — Telehealth: Payer: Self-pay | Admitting: Cardiology

## 2013-03-22 DIAGNOSIS — E782 Mixed hyperlipidemia: Secondary | ICD-10-CM

## 2013-03-22 MED ORDER — FENOFIBRATE 160 MG PO TABS: 160.0000 mg | ORAL_TABLET | Freq: Every day | ORAL | Status: DC

## 2013-03-22 NOTE — Telephone Encounter (Signed)
Returned call and pt verified x 2.  Pt informed message received and that lab results are back as well.  Pt informed Dr. [name] wants to increase dose to 154 mg and Rx will be sent.  Pt advised to double current dose until gone and then pick up new Rx.  Pt verbalized understanding and agreed w/ plan.  Rx sent to pharmacy for fenofibrate 160 mg as it is not available in 154 mg (per Corine Shelter, PA-C) since Dr. [name] is out of the office.

## 2013-03-22 NOTE — Telephone Encounter (Signed)
Message copied by Chauncey Reading on Thu Mar 22, 2013  3:02 PM ------      Message from: [name]      Created: Wed Mar 21, 2013 10:48 PM       We are getting there -- TC, TG & LDL are better. HDL stable.        Still have a ways to go.  Lets increase the Triocr (fenofibrate dose to 154 mg daily --> start by doubling current dose.            [name], MD       ------

## 2013-03-22 NOTE — Telephone Encounter (Signed)
He has My Chart and when he looked at labs Dr [name] had instructed him to increase dosage of fenofidrate  On 48 mg now.  Will need new script written to cover increase and is requesting 90 day supply  Please call

## 2013-03-23 NOTE — Telephone Encounter (Signed)
Okay, I can never tell which one of each of these medicines comes in certain doses.  Some brands do a 154 mg while some do one 160 mg.  [name], MD

## 2013-04-27 DIAGNOSIS — M722 Plantar fascial fibromatosis: Secondary | ICD-10-CM

## 2013-05-24 ENCOUNTER — Encounter: Payer: Self-pay | Admitting: Podiatry

## 2013-06-04 ENCOUNTER — Telehealth: Payer: Self-pay | Admitting: *Deleted

## 2013-06-04 DIAGNOSIS — E785 Hyperlipidemia, unspecified: Secondary | ICD-10-CM

## 2013-06-04 DIAGNOSIS — Z79899 Other long term (current) drug therapy: Secondary | ICD-10-CM

## 2013-06-04 NOTE — Telephone Encounter (Signed)
Pt called to make an appointment and his appointment is April 16th with Dr. [name]. He needs to have a lab slip mailed to him.  DH

## 2013-06-04 NOTE — Telephone Encounter (Signed)
No orders for labs.  Message forwarded to Dr. Donneta Romberg, RN.

## 2013-06-05 NOTE — Telephone Encounter (Signed)
Mailed labslip CMP,LIPID

## 2013-06-20 LAB — LIPID PANEL
CHOL/HDL RATIO: 4.7 ratio
Cholesterol: 218 mg/dL — ABNORMAL HIGH (ref 0–200)
HDL: 46 mg/dL (ref >39)
LDL Cholesterol: 149 mg/dL — ABNORMAL HIGH (ref 0–99)
Triglycerides: 117 mg/dL (ref <150)
VLDL: 23 mg/dL (ref 0–40)

## 2013-06-20 LAB — COMPREHENSIVE METABOLIC PANEL WITH GFR
ALT: 19 U/L (ref 0–53)
AST: 19 U/L (ref 0–37)
Albumin: 4 g/dL (ref 3.5–5.2)
Alkaline Phosphatase: 49 U/L (ref 39–117)
BUN: 13 mg/dL (ref 6–23)
CO2: 25 meq/L (ref 19–32)
Calcium: 8.7 mg/dL (ref 8.4–10.5)
Chloride: 106 meq/L (ref 96–112)
Creat: 0.79 mg/dL (ref 0.50–1.35)
Glucose, Bld: 84 mg/dL (ref 70–99)
Potassium: 4.3 meq/L (ref 3.5–5.3)
Sodium: 139 meq/L (ref 135–145)
Total Bilirubin: 0.6 mg/dL (ref 0.2–1.2)
Total Protein: 6.3 g/dL (ref 6.0–8.3)

## 2013-06-20 NOTE — Telephone Encounter (Signed)
Quick Note:  Chem panel looks great, but cholesterol is worse. HDL is good, but total Chol & LDL are too high for someone with Coronary Artery Disease.  I am going to take advantage our our groups merging to refer the patient to the [facility] Lipid clinic to discuss options.  [name], MD  ______

## 2013-06-21 ENCOUNTER — Ambulatory Visit (INDEPENDENT_AMBULATORY_CARE_PROVIDER_SITE_OTHER): Payer: BC Managed Care – PPO | Admitting: Cardiology

## 2013-06-21 ENCOUNTER — Encounter: Payer: Self-pay | Admitting: Cardiology

## 2013-06-21 VITALS — BP 122/84 | HR 76 | Ht 74.0 in | Wt 229.5 lb

## 2013-06-21 DIAGNOSIS — E8881 Metabolic syndrome: Secondary | ICD-10-CM

## 2013-06-21 DIAGNOSIS — I10 Essential (primary) hypertension: Secondary | ICD-10-CM

## 2013-06-21 DIAGNOSIS — Z9861 Coronary angioplasty status: Secondary | ICD-10-CM

## 2013-06-21 DIAGNOSIS — E785 Hyperlipidemia, unspecified: Secondary | ICD-10-CM

## 2013-06-21 DIAGNOSIS — I2119 ST elevation (STEMI) myocardial infarction involving other coronary artery of inferior wall: Secondary | ICD-10-CM

## 2013-06-21 DIAGNOSIS — Z79899 Other long term (current) drug therapy: Secondary | ICD-10-CM

## 2013-06-21 DIAGNOSIS — I219 Acute myocardial infarction, unspecified: Secondary | ICD-10-CM

## 2013-06-21 DIAGNOSIS — E782 Mixed hyperlipidemia: Secondary | ICD-10-CM

## 2013-06-21 DIAGNOSIS — I2510 Atherosclerotic heart disease of native coronary artery without angina pectoris: Secondary | ICD-10-CM

## 2013-06-21 DIAGNOSIS — R55 Syncope and collapse: Secondary | ICD-10-CM

## 2013-06-21 DIAGNOSIS — E669 Obesity, unspecified: Secondary | ICD-10-CM

## 2013-06-21 MED ORDER — PITAVASTATIN CALCIUM 2 MG PO TABS: 2.0000 mg | ORAL_TABLET | Freq: Every day | ORAL | Status: DC

## 2013-06-21 NOTE — Assessment & Plan Note (Signed)
He is actually now on the cutoff for obesity. Continues to monitor his diet, but is now back active exercising. He is hoping to reduce his weight will also help his lipids. He is still 13 pounds up from where he had been immediately post MI.

## 2013-06-21 NOTE — Patient Instructions (Addendum)
No change in medication, start LIVALIO 2 MG DAILY Stop Aspirin,fenofibrate  LABS 3 MONTH -CMP ,LIPID  Your physician wants you to follow-up in 6 month Dr [name].  You will receive a reminder letter in the mail two months in advance. If you don't receive a letter, please call our office to schedule the follow-up appointment.

## 2013-06-21 NOTE — Assessment & Plan Note (Addendum)
H.I have discussed his case with our lipid clinic pharmacist, who he now is that there is a memory concerns with Crestor as well as Lipitor and Zocor, all of which the patient has tried. He thinks it covers and would not be adequate based on the patient's values, and therefore recommends Livalo. Will stop fenofibrate. Jason Mathis agrees with this plan. Will recheck labs in 3 months.

## 2013-06-21 NOTE — Assessment & Plan Note (Addendum)
Now almost 2 years out from his MI. He would prefer to stay on antiplatelet agent. He is fine with using Effient, but with his nuisance bleeding being significant in his intermittent use of Naprosyn him to stop aspirin. He is on stable dose of beta blocker, ACE inhibitor were stopped when he had the episode of micturition syncope.

## 2013-06-21 NOTE — Assessment & Plan Note (Signed)
Stable blood pressures

## 2013-06-21 NOTE — Assessment & Plan Note (Signed)
Glycemic control appears to be better. Hopefully with weight loss this will improve.

## 2013-06-21 NOTE — Assessment & Plan Note (Signed)
No recurrent anginal symptoms or failure symptoms. He has recovered well and is on a stable regimen.

## 2013-06-21 NOTE — Progress Notes (Signed)
PCP: Orpah Melter, MD  Clinic Note: Chief Complaint  Patient presents with  . 6 month visit    results of labs,no chest pain ,no sob ,no edema   HPI: Jason Mathis is a 58 y.o. male with a PMH below who presents today for this five-month followup.Marland Kitchen He is a very pleasant gentleman who I met during his inferior STEMI in May 2013. He had an occluded RCA for anterior STEMI and was treated with PCI. He also had an LAD lesion noted. We evaluated this with a stress test that showed clearcut LAD ischemia, he was in treatment staged PCI of the LAD. He has had echocardiographic and nuclear stress test evaluation which showed preserved ejection fraction and no scar. He has had and episode of Micturition Syncope back in April 2014 associated with orthostatic hypotension. We stopped his ACE inhibitor at that time. He has had intolerance of statins mostly due to memory issues. Recent Lipids noted below indicate that Fenofibrate alone is not sufficient.  Interval History: He presents today feeling quite well. He is asked to start her back losing weight. Does not that over the cold weather winter he and his wife were less active and had been cleared nightly walking. As well as improved, has gotten back into working Exercise. For a hike in the mountains over the weekend and he did note to be quite short of breath walking and elevation for 4-5 miles. He however denies any chest tightness or pressure with exertion. No dyspnea with routine activities or with routine walking he still has occasional leg cramps at night. He is significant muscle aching in tightness has improved. He notes it is better when he is more active.    He denies any PND, orthopnea or edema to suggest heart failure. No palpitations or rapid heart beats. No recurrent episodes of syncope or near syncope. He is clearly watching his dietary intake and pay attention to when he stands up from going to the bathroom.  The remainder of Cardiovascular  ROS is as follows: negative for - edema, irregular heartbeat, loss of consciousness, murmur, orthopnea, palpitations, paroxysmal nocturnal dyspnea, rapid heart rate or shortness of breath, lightheadedness/dizziness, syncope/near syncope, TIA/amaurosis fugax, melena, hematochezia hematuria, epistaxis or claudication. He does have mild nuisance bleeding and with shaving nicks or mild scrapes.  Past Medical History  Diagnosis Date  . History of ST elevation myocardial infarction (STEMI) of inferior wall 07/15/2011    inferior; PCI of Proximal RCA & LAD  . CAD (coronary artery disease), native coronary artery 07/2011    Additional ~70-80% LAD lesion; Preserved LVEF  . History of nuclear stress test 08/24/2011    bruce protocol; mild ischemia in apical lateral regions; mild perfusion defect due to infarct/scar with mod-severe perinfarct ischemia in basal inferoseptal/basal inferior/mid inferoseptal/mid inferior regions; high risk    . CAD S/P percutaneous coronary angioplasty 09/07/2011    PCI of the LAD - Promus Element DES 4.0x25m postdilated to 4.768m . H/O echocardiogram 07/15/2011    Normal EF 55-60%; no WMA  . Hypertension   . Dyslipidemia, goal LDL below 70 07/15/2011  . Metabolic syndrome     Hgb AiC 6.1 -- low HDL, elevated blood glucose, hypertension  . Obesity (BMI 30-39.9)   . Kidney stones   . Chronic bronchitis     "used to get it every year til a few years ago" (09/07/11)  . History of pneumonia 2003  . Syncope and collapse April 2014    Post-micturition  with orthostatic tendencies.   Prior Cardiac Evaluation   Procedure Laterality Date  . Coronary angioplasty with stent placement  07/2011    PCI of RCA with Promus Element 3.0 mm x 16 mm DES; final diameter 3.25 mm  . Coronary angioplasty with stent placement  09/07/2011    PCI of LAD with Promus Element DES 4.0x15m postdilated to 4.766m . Transthoracic echocardiogram  07/15/2011    EF 55-60%    Allergies  Allergen Reactions  .  Atorvastatin     Memory issues  . Crestor [Rosuvastatin]     Memory issues   . Zetia [Ezetimibe]     Myalgias    INDICATIONS reviewed in Epic   No Change in Social and Family History-- reviewed in Epic   ROS: A comprehensive Review of Systems - Negative except Pertinent symptoms noted above  PHYSICAL EXAM BP 122/84  Pulse 76  Ht _0  (1.88 m)  Wt 229 lb 8 oz (104.101 kg)  BMI 29.45 kg/m2 General appearance: alert, cooperative, appears stated age, no distress, mildly obese and He is relatively healthy appearing, but does appear little heavier than he was before. Oriented x3. Pleasant mood and affect. HEENT: [place]/AT, EOMI, MMM, anicteric sclera Neck: no adenopathy, no carotid bruit, no JVD and supple, symmetrical, trachea midline Lungs: CTA B., normal percussion bilaterally and Nonlabored, and a air movement Heart: RRR, S1, S2 normal, no murmur, click, rub or gallop and normal apical impulse Abdomen: soft, non-tender; bowel sounds normal; no masses,  no organomegaly Extremities: extremities normal, atraumatic, no cyanosis or edema Pulses: 2+ and symmetric Neurologic: Grossly normal  EKMVH:QIONGEXBModay: Yes Rate: 76, Rhythm: NSR, normal axis and intervals; normal  ECG  Recent Labs: From  06/19/2013 : TC 218, TG  117 , HDL 46, LDL 149 ; TG an HDL improved, but others are worse   ASSESSMENT / PLAN: ST elevation myocardial infarction (STEMI) of inferior wall -  PCI to RCA with staged PCI to LAD lesion No recurrent anginal symptoms or failure symptoms. He has recovered well and is on a stable regimen.  CAD S/P percutaneous coronary angioplasty: PCI RCA during STEMI, staged PCI to LAD after positive stress test one month post stenting Now almost 2 years out from his MI. He would prefer to stay on antiplatelet agent. He is fine with using Effient, but with his nuisance bleeding being significant in his intermittent use of Naprosyn him to stop aspirin. He is on stable dose of beta  blocker, ACE inhibitor were stopped when he had the episode of micturition syncope.  HLD (hyperlipidemia) H.I have discussed his case with our lipid clinic pharmacist, who he now is that there is a memory concerns with Crestor as well as Lipitor and Zocor, all of which the patient has tried. He thinks it covers and would not be adequate based on the patient's values, and therefore recommends Livalo. Mr. ZaGranvillegrees with this plan. Will recheck labs in 3 months.   Syncope and collapse No further symptoms. Doing much better with blood pressures while off of ACE inhibitor  HTN (hypertension), benign Stable blood pressures  Obesity (BMI 30-39.9) He is actually now on the cutoff for obesity. Continues to monitor his diet, but is now back active exercising. He is hoping to reduce his weight will also help his lipids. He is still 13 pounds up from where he had been immediately post MI.  Metabolic syndrome, obese, elevated glucose, Hgb A1c 6.1 Glycemic control appears to be better. Hopefully  with weight loss this will improve.    Orders Placed This Encounter  Procedures  . Lipid panel    Standing Status: Future     Number of Occurrences:      Standing Expiration Date: 06/22/2014    Order Specific Question:  Has the patient fasted?    Answer:  Yes  . Comprehensive metabolic panel    Standing Status: Future     Number of Occurrences:      Standing Expiration Date: 06/22/2014    Order Specific Question:  Has the patient fasted?    Answer:  Yes  . EKG 12-Lead   Meds ordered this encounter  Medications  . Pitavastatin Calcium 2 MG TABS    Sig: Take 1 tablet (2 mg total) by mouth daily after supper.    Dispense:  30 tablet    Refill:  6    Followup: 6 months  Ignace Mandigo W. Ellyn Hack, M.D., M.S. [facility] [address] [place], [place]  [address]  [phone] Pager # [phone]

## 2013-06-21 NOTE — Assessment & Plan Note (Signed)
No further symptoms. Doing much better with blood pressures while off of ACE inhibitor

## 2013-08-28 ENCOUNTER — Telehealth: Payer: Self-pay | Admitting: *Deleted

## 2013-08-28 ENCOUNTER — Other Ambulatory Visit: Payer: Self-pay | Admitting: *Deleted

## 2013-08-28 DIAGNOSIS — E782 Mixed hyperlipidemia: Secondary | ICD-10-CM

## 2013-08-28 DIAGNOSIS — Z79899 Other long term (current) drug therapy: Secondary | ICD-10-CM

## 2013-08-28 NOTE — Telephone Encounter (Signed)
Mail letter and labslip  

## 2013-08-28 NOTE — Telephone Encounter (Signed)
Message copied by Tobin Chad on Tue Aug 28, 2013  5:11 PM ------      Message from: Tobin Chad      Created: Thu Jun 21, 2013  8:32 AM       Cmp,lipid mail in 08/2013            Due in 09/2013, f/u office appt 12/2013 ------

## 2013-09-13 LAB — LIPID PANEL
CHOL/HDL RATIO: 3.1 ratio
CHOLESTEROL: 151 mg/dL (ref 0–200)
HDL: 49 mg/dL (ref >39)
LDL Cholesterol: 89 mg/dL (ref 0–99)
Triglycerides: 64 mg/dL (ref <150)
VLDL: 13 mg/dL (ref 0–40)

## 2013-09-13 LAB — COMPREHENSIVE METABOLIC PANEL
ALBUMIN: 4.5 g/dL (ref 3.5–5.2)
ALT: 22 U/L (ref 0–53)
AST: 32 U/L (ref 0–37)
Alkaline Phosphatase: 49 U/L (ref 39–117)
BUN: 12 mg/dL (ref 6–23)
CALCIUM: 9 mg/dL (ref 8.4–10.5)
CHLORIDE: 101 meq/L (ref 96–112)
CO2: 24 meq/L (ref 19–32)
Creat: 0.9 mg/dL (ref 0.50–1.35)
Glucose, Bld: 99 mg/dL (ref 70–99)
POTASSIUM: 4.4 meq/L (ref 3.5–5.3)
Sodium: 136 mEq/L (ref 135–145)
TOTAL PROTEIN: 6.5 g/dL (ref 6.0–8.3)
Total Bilirubin: 1 mg/dL (ref 0.2–1.2)

## 2013-09-18 ENCOUNTER — Telehealth: Payer: Self-pay | Admitting: Cardiology

## 2013-09-18 NOTE — Telephone Encounter (Signed)
Spoke to patient Results given lipid , CMP. Patient states he will get prescriptions fill  Patient just question about continuing Effient- patient states it was a conversation at last visit Will defer to Dr [name] AND CALL PATIENT BACK Patient verbalized understanding.

## 2013-09-18 NOTE — Telephone Encounter (Signed)
Forward to Sharon RN 

## 2013-09-18 NOTE — Telephone Encounter (Signed)
Has questions about had lab work done last week and would like the results and his prescription is due and would like the lab results before he picks up his prescription , and his prescription for Effient is about to run and wants to know can he discontinue the use of that medication?  Please call     Thanks

## 2013-09-18 NOTE — Telephone Encounter (Signed)
Fine either way. Jason Lex, MD

## 2013-09-18 NOTE — Telephone Encounter (Signed)
Labs look good - cholesterol is much better!!  If he would like to change to Plavix that is fine.  We will check P2Y12 level in 2 weeks after change.    [name], MD

## 2013-09-18 NOTE — Telephone Encounter (Signed)
Notified patient. He states that he is not having any problems so he would like to stay with Effient

## 2013-10-08 ENCOUNTER — Telehealth: Payer: Self-pay | Admitting: Cardiology

## 2013-10-08 MED ORDER — PITAVASTATIN CALCIUM 2 MG PO TABS: 2.0000 mg | ORAL_TABLET | Freq: Every day | ORAL | Status: DC

## 2013-10-08 NOTE — Telephone Encounter (Signed)
Rx was sent to pharmacy electronically. 

## 2013-10-08 NOTE — Telephone Encounter (Signed)
Jason Mathis is calling because he needs a new prescription for his Livalo sent to his pharmacy for a 90 supply instead of a 30 day .[facility] [place] .Marland KitchenThanks

## 2013-12-22 ENCOUNTER — Other Ambulatory Visit: Payer: Self-pay | Admitting: Cardiology

## 2013-12-22 NOTE — Telephone Encounter (Signed)
Rx was sent to pharmacy electronically. 

## 2013-12-31 ENCOUNTER — Other Ambulatory Visit: Payer: Self-pay | Admitting: Cardiology

## 2014-01-01 ENCOUNTER — Telehealth: Payer: Self-pay | Admitting: Cardiology

## 2014-01-01 NOTE — Telephone Encounter (Signed)
Left message to call back - who is doing procedure and when?  Routed to Dr. [name] to advise on holding effient.Marland Kitchen

## 2014-01-01 NOTE — Telephone Encounter (Signed)
Dr Kinnie Scales will be doing procedure 12/8

## 2014-01-01 NOTE — Telephone Encounter (Signed)
Rx was sent to pharmacy electronically. 

## 2014-01-01 NOTE — Telephone Encounter (Signed)
OK to hold Effient - 9 days pre-op.  Restart ~2 d post-op.  [name], MD

## 2014-01-01 NOTE — Telephone Encounter (Signed)
Pt needs a colonoscopy,wants to know what he needs to do about his Effient.

## 2014-01-02 NOTE — Telephone Encounter (Signed)
Patient notified. Dr. Kinnie Scales cc'ed on this telephone encounter as FYI.

## 2014-01-02 NOTE — Telephone Encounter (Signed)
SPOKE TO PATIENT - verbalized understanding. and sent information Dr Kinnie Scales

## 2014-01-30 ENCOUNTER — Telehealth: Payer: Self-pay | Admitting: *Deleted

## 2014-01-30 NOTE — Telephone Encounter (Signed)
Patient  Walk -in on 01/17/14, with a letter from his medical insurance in regards to taking Livalo Per letter, starting in 03/08/14 this medication will need a prior authorization.   [facility]-- spoke to representative Karren Burly-  She states not able to do prior authorization the forms are not available yet for the new year. She states conact company closer to [facility] 2016. RN voiced understanding.  RN called patient and informed patient. Patient states he just received medication on 01/05/14- (# 90 day supply.) RN informed patient will try again closer in  Dec/jan.

## 2014-02-14 ENCOUNTER — Encounter (HOSPITAL_COMMUNITY): Payer: Self-pay | Admitting: Cardiology

## 2014-02-16 ENCOUNTER — Other Ambulatory Visit: Payer: Self-pay | Admitting: Cardiology

## 2014-02-18 NOTE — Telephone Encounter (Signed)
Rx refill sent to patient pharmacy   

## 2014-03-05 ENCOUNTER — Telehealth: Payer: Self-pay | Admitting: *Deleted

## 2014-03-05 NOTE — Telephone Encounter (Signed)
-----   Message from Tobin Chad, RN sent at 01/30/2014  2:34 PM EST ----- 00 AM     Jason Mathis   MRN: [id number] DOB: Jul 08, 1955   Pt Work: [phone] Pt Home: [phone]     Message    Contact insurance about prior auth. of livalo for jan 2016   Patient is on livalo at present-- tried crestor,atorvastatin,zetia    [phone]         Cover options-- atorvastatin, fluvastatin, lovastatin, pravastatin, simvastatin, crestor, simcor, vytorin

## 2014-03-05 NOTE — Telephone Encounter (Signed)
SPOKE TO PRIOR AUTHORIZATION [facility] REPRESENTATIVE STATES CLAIM CAN NOT BE PROCESS UNTIL AFTER Mar 08 2014 NOTIFIED PATIENT.HE VERBALIZED UNDERSTANDING.

## 2014-03-12 ENCOUNTER — Ambulatory Visit (INDEPENDENT_AMBULATORY_CARE_PROVIDER_SITE_OTHER): Payer: BLUE CROSS/BLUE SHIELD | Admitting: Cardiology

## 2014-03-12 ENCOUNTER — Encounter: Payer: Self-pay | Admitting: Cardiology

## 2014-03-12 VITALS — BP 130/90 | HR 95 | Ht 73.0 in | Wt 241.0 lb

## 2014-03-12 DIAGNOSIS — I2119 ST elevation (STEMI) myocardial infarction involving other coronary artery of inferior wall: Secondary | ICD-10-CM

## 2014-03-12 DIAGNOSIS — R55 Syncope and collapse: Secondary | ICD-10-CM

## 2014-03-12 DIAGNOSIS — Z9861 Coronary angioplasty status: Secondary | ICD-10-CM

## 2014-03-12 DIAGNOSIS — E785 Hyperlipidemia, unspecified: Secondary | ICD-10-CM

## 2014-03-12 DIAGNOSIS — I10 Essential (primary) hypertension: Secondary | ICD-10-CM

## 2014-03-12 DIAGNOSIS — E669 Obesity, unspecified: Secondary | ICD-10-CM

## 2014-03-12 DIAGNOSIS — I2510 Atherosclerotic heart disease of native coronary artery without angina pectoris: Secondary | ICD-10-CM

## 2014-03-12 NOTE — Patient Instructions (Addendum)
Your physician wants you to follow-up in: 6 months with Dr. Harding. You will receive a reminder letter in the mail two months in advance. If you don't receive a letter, please call our office to schedule the follow-up appointment.  

## 2014-03-13 ENCOUNTER — Encounter: Payer: Self-pay | Admitting: Cardiology

## 2014-03-13 NOTE — Assessment & Plan Note (Signed)
No further symptoms. Would tolerate upper limit of normal blood pressures.

## 2014-03-13 NOTE — Progress Notes (Signed)
PCP: [name], MD  Clinic Note: Chief Complaint  Patient presents with  . 11 month visit    occasional "gas" pains and muscle  . Coronary Artery Disease    Inferior STEMI status post PCI   HPI: Jason Mathis is a 59 y.o. male with a history of inferior stenting back in May of 2013 with an occluded RCA. He had PCI to the RCA and then had followup PCI to LAD lesion following an abnormal Myoview performed 4 weeks post MI -- according to the guidelines at the time.  Past Medical History  Diagnosis Date  . History of ST elevation myocardial infarction (STEMI) of inferior wall 07/15/2011    inferior; PCI of Proximal RCA & LAD  . CAD (coronary artery disease), native coronary artery 07/2011    Additional ~70-80% LAD lesion; Preserved LVEF  . History of nuclear stress test 08/24/2011    bruce protocol; mild ischemia in apical lateral regions; mild perfusion defect due to infarct/scar with mod-severe perinfarct ischemia in basal inferoseptal/basal inferior/mid inferoseptal/mid inferior regions; high risk    . CAD S/P percutaneous coronary angioplasty 09/07/2011    PCI of the LAD - Promus Element DES 4.0x83mm postdilated to 4.36mm  . H/O echocardiogram 07/15/2011    Normal EF 55-60%; no WMA  . Hypertension   . Dyslipidemia, goal LDL below 70 07/15/2011  . Metabolic syndrome     Hgb AiC 6.1 -- low HDL, elevated blood glucose, hypertension  . Obesity (BMI 30-39.9)   . Kidney stones   . Chronic bronchitis     "used to get it every year til a few years ago" (09/07/11)  . History of pneumonia 2003  . Syncope and collapse April 2014    Post-micturition with orthostatic tendencies.   Interval History: he has been relatively stable since his last visit., he is doing quite well having major complaints. No further syncopal events since his micturition syncope back in April 2014. We stopped his ACE inhibitor but cleared up his symptoms. He seems to be tolerating the Livalo very well without problems.  His lipid control is doing great. He feels overall cardiac standpoint "great. He is able to do when her rectum and he wants to without difficulty. No chest tightness/pressure to suggest angina with rest or exertion. He does occasionally get some muscle twinges in the left side of his chest in the midaxillary line but nothing that is like angina. No resting or exertional dyspnea.  No PND, orthopnea or edema. No palpitations, lightheadedness, dizziness, weakness, syncope/near syncope, or TIA/amaurosis fugax symptoms.  He has put on weight over the holidays. All the rain make it less appealing to do exercise and over indulged. He also got a habit of doing his routine daily walking exercise and that he was doing in the past. Basically the seat of the summer was excuse, and he never got back into the routine. Now the weather has improved he is starting to get back into exercising and she states currently is back to his previous weight which was in the 210-220 lb range.  ROS: A comprehensive was performed. Review of Systems  Constitutional: Negative for weight loss and malaise/fatigue.       Weight gain due to lack of exercise and overeating  HENT: Negative for nosebleeds.   Respiratory: Negative for cough.   Cardiovascular: Negative for claudication.  Gastrointestinal: Negative for constipation, blood in stool and melena.  Genitourinary: Negative for hematuria.  Musculoskeletal: Positive for joint pain and neck pain (  and lateral chest wall pain).  Endo/Heme/Allergies: Does not bruise/bleed easily.  Psychiatric/Behavioral: Negative for memory loss (Notably improved after switching from Crestor to Livalo). The patient is not nervous/anxious and does not have insomnia.   All other systems reviewed and are negative.   Current Outpatient Prescriptions on File Prior to Visit  Medication Sig Dispense Refill  . carvedilol (COREG) 6.25 MG tablet TAKE 1 TABLET TWICE A DAY 180 tablet 1  . EFFIENT 10 MG TABS  tablet TAKE 1 TABLET BY MOUTH EVERY DAY 90 tablet 1  . NAPROXEN PO Take 1-3 tablets by mouth every 12 (twelve) hours as needed (back pain).    [facility] NITROSTAT 0.4 MG SL tablet PLACE 1 TABLET UNDER THE TONGUE EVERY 5 (FIVE) MINUTES AS NEEDED FOR CHEST PAIN 100 tablet 1  . Pitavastatin Calcium 2 MG TABS Take 1 tablet (2 mg total) by mouth daily after supper. 90 tablet 1   No current facility-administered medications on file prior to visit.   Allergies  Allergen Reactions  . Atorvastatin     Memory issues  . Crestor [Rosuvastatin]     Memory issues   . Zetia [Ezetimibe]     Myalgias    SOCIAL AND FAMILY HISTORY REVIEWED IN EPIC -- No change  Wt Readings from Last 3 Encounters:  03/12/14 241 lb (109.317 kg)  06/21/13 229 lb 8 oz (104.101 kg)  01/21/13 238 lb 8.6 oz (108.2 kg)    PHYSICAL EXAM BP 130/90 mmHg  Pulse 95  Ht  (1.854 m)  Wt 241 lb (109.317 kg)  BMI 31.80 kg/m2 General appearance: alert, cooperative, appears stated age, no distress, mildly obese ; healthy appearing, but does appear little heavier than he was before. Oriented x3. Pleasant mood and affect. HEENT: [place]/AT, EOMI, MMM, anicteric sclera Neck: no adenopathy, no carotid bruit, no JVD and supple, symmetrical, trachea midline Lungs: CTA B., normal percussion bilaterally and Nonlabored, and a air movement Heart: RRR, S1, S2 normal, no murmur, click, rub or gallop and normal apical impulse Abdomen: soft, non-tender; bowel sounds normal; no masses, no organomegaly Extremities: extremities normal, atraumatic, no cyanosis or edema Pulses: 2+ and symmetric Neurologic: Grossly normal   Adult ECG Report  Rate: 95 ;  Rhythm: normal sinus rhythm and With nonspecific ST and T wave abnormalities. Q waves noted in lead 3 only.  Narrative Interpretation: stable EKG; criteria for inferior infarct, age undetermined is no longer present  Recent Labs:   Lab Results  Component Value Date   CHOL 151 09/13/2013   HDL 49  09/13/2013   LDLCALC 89 09/13/2013   TRIG 64 09/13/2013   CHOLHDL 3.1 09/13/2013    ASSESSMENT / PLAN: ST elevation myocardial infarction (STEMI) of inferior wall -  PCI to RCA with staged PCI to LAD lesion He did very well post MI. Preserved EF with no residual significant scar noted on followup stress test. He has not had any anginal or heart failure symptoms since his second PCI. He is well aware of his anginal symptoms and continue to monitor.  CAD S/P percutaneous coronary angioplasty: PCI RCA during STEMI, staged PCI to LAD after positive stress test one month post stenting 2 1/2 years out from his PCI. He feels very comfortable with Effient and no aspirin. There does not appear to be a plethora of day that for Effient long-term, but Brilinta has had data supporting long-term prevention with continued use. He would prefer to just simply stay with the current medication, however if financial  issues become a problem he would consider switching to Plavix. For now he has a great deal with Effient and would just as soon stay on it. He's concerned about the breathing issues with Brilinta  On stable beta blocker dose. ACE inhibitor was stopped due to micturition syncope and orthostatic symptoms.  Essential hypertension Diastolic pressure is a little bit high today. I imagine with losing back some weight his blood pressure will be in better control. For now we'll just continue with current carvedilol dose.  Hyperlipidemia with target LDL less than 70 Notable improvement in control based on his labs in July. LDL showed a dramatic decrease down from 149 to 89 with him being on Livalo.HDL is also increased from 39-49. Total cholesterol reduced from 218 151 and triglycerides are down to 64 from 136. Continue Livalo - Will need to recheck lipid panel at the time of this followup visit.  Syncope and collapse No further symptoms. Would tolerate upper limit of normal blood pressures.  Obesity (BMI  30-39.9) The patient understands the need to lose weight with diet and exercise. We have discussed specific strategies for this.     No orders of the defined types were placed in this encounter.   Meds ordered this encounter  Medications  . Multiple Vitamin (MULTIVITAMIN) capsule    Sig: Take 1 capsule by mouth daily.     Followup: 6 months   HARDING, Piedad Climes, M.D., M.S. Interventional Cardiologist   Pager # [phone]

## 2014-03-13 NOTE — Assessment & Plan Note (Signed)
He did very well post MI. Preserved EF with no residual significant scar noted on followup stress test. He has not had any anginal or heart failure symptoms since his second PCI. He is well aware of his anginal symptoms and continue to monitor.

## 2014-03-13 NOTE — Assessment & Plan Note (Signed)
Diastolic pressure is a little bit high today. I imagine with losing back some weight his blood pressure will be in better control. For now we'll just continue with current carvedilol dose.

## 2014-03-13 NOTE — Assessment & Plan Note (Addendum)
2 1/2 years out from his PCI. He feels very comfortable with Effient and no aspirin. There does not appear to be a plethora of day that for Effient long-term, but Brilinta has had data supporting long-term prevention with continued use. He would prefer to just simply stay with the current medication, however if financial issues become a problem he would consider switching to Plavix. For now he has a great deal with Effient and would just as soon stay on it. He's concerned about the breathing issues with Brilinta  On stable beta blocker dose. ACE inhibitor was stopped due to micturition syncope and orthostatic symptoms.

## 2014-03-13 NOTE — Assessment & Plan Note (Signed)
The patient understands the need to lose weight with diet and exercise. We have discussed specific strategies for this.  

## 2014-03-13 NOTE — Assessment & Plan Note (Signed)
Notable improvement in control based on his labs in July. LDL showed a dramatic decrease down from 149 to 89 with him being on Livalo.HDL is also increased from 39-49. Total cholesterol reduced from 218 151 and triglycerides are down to 64 from 136. Continue Livalo - Will need to recheck lipid panel at the time of this followup visit.

## 2014-04-03 ENCOUNTER — Other Ambulatory Visit: Payer: Self-pay | Admitting: Cardiology

## 2014-04-03 NOTE — Telephone Encounter (Signed)
Rx(s) sent to pharmacy electronically.  

## 2014-04-04 ENCOUNTER — Telehealth: Payer: Self-pay | Admitting: *Deleted

## 2014-04-04 NOTE — Telephone Encounter (Signed)
Prior authorization started on livalo.

## 2014-04-10 ENCOUNTER — Telehealth: Payer: Self-pay | Admitting: Cardiology

## 2014-04-10 NOTE — Telephone Encounter (Signed)
Pt wants to know if his prior authorization is complete for his Livalo.

## 2014-04-10 NOTE — Telephone Encounter (Signed)
Spoke with pt, aware just waiting from insurance company.

## 2014-04-12 ENCOUNTER — Telehealth: Payer: Self-pay | Admitting: Cardiology

## 2014-04-12 NOTE — Telephone Encounter (Signed)
Pt called in stating that he received a letter from his insurance company saying that he could no longer get Livalo and he would like to speak to a nurse about this because all the other statins that he tried did not work. Please call  Thanks

## 2014-04-12 NOTE — Telephone Encounter (Signed)
I had a pleasant conversation w/ this patient. He was alerted to possibility that Prior Auth may have been refused, stating he did get a letter from his insurance. Upon inspection, appears the date of the letter was 1/28, same day as PA was initiated for his Livalo. I informed him that the correspondence may have been sent to him before insurance received PA request from [place], and that I see we filled a PA sometime but do not see a date when PA was done.  Will route to Stanton Kidney d/t her knowledge of previous correspondence.  I acknowledged his trouble w/ insurance company in keeping on his medication. Livalo seems to work w/ great results, and he states he has been on numerous other agents (2 statins, Zetia, Fenofibrate) w/ poor results. He has ~1.5 week's supply left of this medication.

## 2014-04-16 MED ORDER — PRAVASTATIN SODIUM 40 MG PO TABS: 40.0000 mg | ORAL_TABLET | Freq: Every evening | ORAL | Status: DC

## 2014-04-16 MED ORDER — PITAVASTATIN CALCIUM 2 MG PO TABS: 2.0000 mg | ORAL_TABLET | Freq: Every day | ORAL | Status: DC

## 2014-04-16 NOTE — Telephone Encounter (Signed)
RN called patient informed patient  -of the progress of prior author. Patient is aware that  this will have to be defer to Dr [name].   Samples (4 boxes) available for pick up along with a savings card for 11 refills [facility] Will patientcontact patient of decision

## 2014-04-16 NOTE — Telephone Encounter (Signed)
Spoke to patient.  patient is aware will try Pravastatin 40 mg- call back with progress report  Patient is aware to only take one agent. Patient state she picked up samples of livalo- will try pravastatin first

## 2014-04-16 NOTE — Telephone Encounter (Signed)
This is such a pain.  Lets do a Rx for Pravastatin 40 mg --> he can "try" for a month to see if memory issues occur.  When & if they do, we can then say he has tried 3 different statins.   I hate [employer].Marland Kitchen  DH

## 2014-04-16 NOTE — Telephone Encounter (Signed)
Spoke to representative  -Prior Chartered loss adjuster. was denied  Patient has not tried 3 of generic medications- simvastatin, simcor, atorvastatin, lovastatin,pravastatin,fluvastatin,crestor,vytorin. Patient has tried atorvastatin,crestor, zetia.- (memory loss)  Per representative patient has try one more medication. Office has one more prior auth appeal to try. RN informed representative  - will review chart again to see if another medication was used.

## 2014-05-14 ENCOUNTER — Telehealth: Payer: Self-pay | Admitting: Cardiology

## 2014-05-14 NOTE — Telephone Encounter (Signed)
Sounds like a good enough reason for Prior Auth.  DH.

## 2014-05-14 NOTE — Telephone Encounter (Signed)
The Pravastatin is causing severe memory problems.Pt says he have stopped taking it.

## 2014-05-14 NOTE — Telephone Encounter (Signed)
Pt calling to inform Dr [name] and nurse that he tried taking the Pravastatin as ordered and had side effects with this.  Pt states he has had multiple side effects with multiple statins in the past.  Pt states he was ordered Livalo, which worked very well for him, but insurance denied this coverage until he failed another statin (Pravastatin).  Pt states he has a months worth of Livalo samples on hand and has resumed in taking these and d/c the Pravastatin. Pt reports the Pravastatin caused him memory issues.  Pt would like for Dr [name] and nurse to start the prior auth process with the Livalo through his insurance, being he failed the Pravastatin.  Pt states this is no urgent need, for he does have a months worth of Livalo on hand.  Informed the pt that I will route this message to Dr [name] and nurse for further review and follow-up of prior auth.  Pt verbalized understanding and agrees with this plan.  Pravastatin updated in pts allergies.

## 2014-05-16 NOTE — Telephone Encounter (Signed)
CALLED,SPOKE TO DIPALI (REPRESENTATIVE) FOR PRIOR AUTH. FOR LIVALIO 2 MG DAILY P.A.was completed  Over the phone. Patient had tried atorvastatin in 2013, crestor  2014 , pravastatin 2016 ,zetia -all caused memory loss It was approved for one year.

## 2014-05-16 NOTE — Telephone Encounter (Signed)
Informed patient, that medication has been approved. Pharmacy notified.

## 2014-06-23 ENCOUNTER — Other Ambulatory Visit: Payer: Self-pay | Admitting: Cardiology

## 2014-06-24 NOTE — Telephone Encounter (Signed)
Rx(s) sent to pharmacy electronically.  

## 2014-08-11 ENCOUNTER — Other Ambulatory Visit: Payer: Self-pay | Admitting: Cardiology

## 2014-08-12 NOTE — Telephone Encounter (Signed)
Rx has been sent to the pharmacy electronically. ° °

## 2014-11-13 ENCOUNTER — Other Ambulatory Visit: Payer: Self-pay | Admitting: Cardiology

## 2014-11-13 NOTE — Telephone Encounter (Signed)
Rx request sent to pharmacy.  

## 2014-11-24 ENCOUNTER — Other Ambulatory Visit: Payer: Self-pay | Admitting: Cardiology

## 2014-11-25 NOTE — Telephone Encounter (Signed)
REFILL 

## 2014-12-07 ENCOUNTER — Other Ambulatory Visit: Payer: Self-pay | Admitting: Cardiology

## 2014-12-09 NOTE — Telephone Encounter (Signed)
Rx(s) sent to pharmacy electronically.  

## 2014-12-20 ENCOUNTER — Other Ambulatory Visit: Payer: Self-pay | Admitting: Cardiology

## 2014-12-20 MED ORDER — PITAVASTATIN CALCIUM 2 MG PO TABS: ORAL_TABLET | ORAL | Status: DC

## 2014-12-20 NOTE — Telephone Encounter (Signed)
Rx(s) sent to pharmacy electronically.  

## 2014-12-20 NOTE — Telephone Encounter (Signed)
Calling because he is wanting to have his Livalo changed from a 30 day to a 90 day prescription .Marland Kitchen Please call   Thanks

## 2015-04-07 ENCOUNTER — Other Ambulatory Visit: Payer: Self-pay | Admitting: Cardiology

## 2015-04-09 ENCOUNTER — Telehealth: Payer: Self-pay | Admitting: Cardiology

## 2015-04-09 MED ORDER — PRASUGREL HCL 10 MG PO TABS: 10.0000 mg | ORAL_TABLET | Freq: Every day | ORAL | Status: DC

## 2015-04-09 NOTE — Telephone Encounter (Signed)
Rx(s) sent to pharmacy electronically.  

## 2015-04-09 NOTE — Telephone Encounter (Signed)
Patient states he needs refill on his Effient.  He has called the pharmacy and was told he needed to schedule a visit with Dr. [name],  Patient has appointment 04/17/15.  [facility] his medication--he only has 1 tablet left.

## 2015-04-17 ENCOUNTER — Encounter: Payer: Self-pay | Admitting: Cardiology

## 2015-04-17 ENCOUNTER — Ambulatory Visit (INDEPENDENT_AMBULATORY_CARE_PROVIDER_SITE_OTHER): Payer: BLUE CROSS/BLUE SHIELD | Admitting: Cardiology

## 2015-04-17 VITALS — BP 100/80 | HR 74 | Ht 73.0 in | Wt 218.0 lb

## 2015-04-17 DIAGNOSIS — Z9861 Coronary angioplasty status: Secondary | ICD-10-CM | POA: Diagnosis not present

## 2015-04-17 DIAGNOSIS — I2119 ST elevation (STEMI) myocardial infarction involving other coronary artery of inferior wall: Secondary | ICD-10-CM | POA: Diagnosis not present

## 2015-04-17 DIAGNOSIS — E785 Hyperlipidemia, unspecified: Secondary | ICD-10-CM | POA: Diagnosis not present

## 2015-04-17 DIAGNOSIS — R55 Syncope and collapse: Secondary | ICD-10-CM | POA: Diagnosis not present

## 2015-04-17 DIAGNOSIS — Z79899 Other long term (current) drug therapy: Secondary | ICD-10-CM

## 2015-04-17 DIAGNOSIS — I10 Essential (primary) hypertension: Secondary | ICD-10-CM

## 2015-04-17 DIAGNOSIS — E8881 Metabolic syndrome: Secondary | ICD-10-CM

## 2015-04-17 DIAGNOSIS — I2510 Atherosclerotic heart disease of native coronary artery without angina pectoris: Secondary | ICD-10-CM

## 2015-04-17 DIAGNOSIS — E669 Obesity, unspecified: Secondary | ICD-10-CM

## 2015-04-17 MED ORDER — METOPROLOL SUCCINATE ER 25 MG PO TB24: 25.0000 mg | ORAL_TABLET | Freq: Every day | ORAL | Status: DC

## 2015-04-17 MED ORDER — PITAVASTATIN CALCIUM 2 MG PO TABS: ORAL_TABLET | ORAL | Status: DC

## 2015-04-17 MED ORDER — CLOPIDOGREL BISULFATE 75 MG PO TABS: 75.0000 mg | ORAL_TABLET | Freq: Every day | ORAL | Status: DC

## 2015-04-17 NOTE — Progress Notes (Signed)
PCP: [name], MD  Clinic Note: Chief Complaint  Patient presents with  . Follow-up    no chest pain, no shortness of breath, no swelling, some cramping, no dizziness or lightheadedness  . Coronary Artery Disease    history of inferior stenting back in May of 2013 with an occluded RCA. He had PCI to the RCA and then had followup PCI to LAD lesion following an abnormal Myoview performed 4 weeks post MI -- according to the guidelines at the time.  HPI: Jason Mathis is a 60 y.o. male with a PMH below who presents today for annual f/u of CAD-STEMI-PCI.   Jason Mathis was last seen in Jan 2016  Recent Hospitalizations: none  Studies Reviewed: none  Interval History: Jason Mathis presents today doing very well. He continues to be very active without any major complaints. He is quite happy to announce the birth of his new grandson, but sad to announce that his sister died recently. He is doing kayaking as his main exercise several days a week for about 2 and half hours a day. He is been also pink-white a bit attention to his diet and has definitely lost some weight. He feels much better & more energetic. Notable complaint is that he still has some bruising from Effient is doing quite well with little low. Does not have any memory issues while taking it. He also notes some positional dizziness. Since he has lost so much weight, his blood pressure has been lower. Cardiac review of symptoms as follows:  No chest pain or shortness of breath with rest or exertion.  No PND, orthopnea or edema.  No palpitations,syncope/near syncope. No TIA/amaurosis fugax symptoms. No claudication.  ROS: A comprehensive was performed. Review of Systems  Constitutional: Negative for malaise/fatigue.  HENT: Positive for nosebleeds.   Eyes: Negative for blurred vision.  Respiratory: Negative for cough and shortness of breath.   Cardiovascular: Negative.        Per history of present illness  Gastrointestinal:  Negative for blood in stool and melena.  Genitourinary: Negative for hematuria.  Neurological: Negative.  Negative for headaches.  Endo/Heme/Allergies: Bruises/bleeds easily.  Psychiatric/Behavioral: Negative for depression and memory loss. The patient does not have insomnia.   All other systems reviewed and are negative.   Past Medical History  Diagnosis Date  . History of ST elevation myocardial infarction (STEMI) of inferior wall 07/15/2011    inferior; PCI of Proximal RCA & LAD  . CAD (coronary artery disease), native coronary artery 07/2011    Additional ~70-80% LAD lesion; Preserved LVEF  . History of nuclear stress test 08/24/2011    bruce protocol; mild ischemia in apical lateral regions; mild perfusion defect due to infarct/scar with mod-severe perinfarct ischemia in basal inferoseptal/basal inferior/mid inferoseptal/mid inferior regions; high risk    . CAD S/P percutaneous coronary angioplasty 09/07/2011    PCI of the LAD - Promus Element DES 4.0x11mm postdilated to 4.43mm  . H/O echocardiogram 07/15/2011    Normal EF 55-60%; no WMA  . Hypertension   . Dyslipidemia, goal LDL below 70 07/15/2011  . Metabolic syndrome     Hgb AiC 6.1 -- low HDL, elevated blood glucose, hypertension  . Obesity (BMI 30-39.9)   . Kidney stones   . Chronic bronchitis (HCC)     "used to get it every year til a few years ago" (09/07/11)  . History of pneumonia 2003  . Syncope and collapse April 2014    Post-micturition with orthostatic tendencies.  Past Surgical History  Procedure Laterality Date  . Coronary angioplasty with stent placement  07/2011    PCI of RCA with Promus Element 3.0 mm x 16 mm DES; final diameter 3.25 mm  . Coronary angioplasty with stent placement  09/07/2011    PCI of LAD with Promus Element DES 4.0x16mm postdilated to 4.32mm  . Tonsillectomy  1968  . Lithotripsy  1999  . Anal sphincterotomy  1996  . Knee arthroscopy  1999; 9/20099    r/t left knee meniscal tear  .  Transthoracic echocardiogram  07/15/2011    EF 55-60%  . Left heart catheterization with coronary angiogram N/A 07/14/2011    Procedure: LEFT HEART CATHETERIZATION WITH CORONARY ANGIOGRAM;  Surgeon: [name], MD;  Location: [facility] CATH LAB;  Service: Cardiovascular;  Laterality: N/A;  . Percutaneous coronary stent intervention (pci-s)  07/14/2011    Procedure: PERCUTANEOUS CORONARY STENT INTERVENTION (PCI-S);  Surgeon: [name], MD;  Location: [facility] CATH LAB;  Service: Cardiovascular;;  . Left heart catheterization with coronary angiogram N/A 09/07/2011    Procedure: LEFT HEART CATHETERIZATION WITH CORONARY ANGIOGRAM;  Surgeon: [name], MD;  Location: [facility] CATH LAB;  Service: Cardiovascular;  Laterality: N/A;  . Percutaneous coronary stent intervention (pci-s) Right 09/07/2011    Procedure: PERCUTANEOUS CORONARY STENT INTERVENTION (PCI-S);  Surgeon: [name], MD;  Location: [facility] CATH LAB;  Service: Cardiovascular;  Laterality: Right;   Prior to Admission medications   Medication Sig Start Date End Date Taking? Authorizing Provider  carvedilol (COREG) 6.25 MG tablet Take 1 tablet (6.25 mg total) by mouth 2 (two) times daily with a meal. <PLEASE MAKE APPOINTMENT FOR REFILLS> 12/09/14  Yes [name], MD  Multiple Vitamin (MULTIVITAMIN) capsule Take 1 capsule by mouth daily.   Yes Historical Provider, MD  NAPROXEN PO Take 1-3 tablets by mouth every 12 (twelve) hours as needed (back pain).   Yes Historical Provider, MD  NITROSTAT 0.4 MG SL tablet PLACE 1 TABLET UNDER THE TONGUE EVERY 5 (FIVE) MINUTES AS NEEDED FOR CHEST PAIN 12/22/13  Yes [name], MD  Pitavastatin Calcium (LIVALO) 2 MG TABS TAKE 1 TABLET (2 MG TOTAL) BY MOUTH DAILY AFTER SUPPER. 12/20/14  Yes [name], MD  prasugrel (EFFIENT) 10 MG TABS tablet Take 1 tablet (10 mg total) by mouth daily. MUST KEEP APPOINTMENT 04/17/2015 WITH DR [name] FOR FUTURE REFILLS 04/09/15  Yes [name], MD  ALPRAZolam Prudy Feeler) 0.25  MG tablet 0.25 mg at bedtime as needed. 04/16/15   Historical Provider, MD  FLUoxetine (PROZAC) 40 MG capsule 40 mg once. 04/07/15   Historical Provider, MD   Allergies  Allergen Reactions  . Atorvastatin     Memory issues  . Crestor [Rosuvastatin]     Memory issues   . Pravastatin Other (See Comments)    Memory issues  . Zetia [Ezetimibe]     Myalgias      Social History   Social History  . Marital Status: Married    Spouse Name: N/A  . Number of Children: 2  . Years of Education: PhD.    Occupational History  .  [employer]   Social History Main Topics  . Smoking status: Never Smoker   . Smokeless tobacco: Former Neurosurgeon    Types: Chew    Quit date: 03/09/2003  . Alcohol Use: 4.2 oz/week    7 Glasses of wine per week     Comment: 11/28/2012-occasional glass of wine, 09/07/11 "nothing since MI 07/14/11"  .  Drug Use: No  . Sexual Activity: Yes   Other Topics Concern  . None   Social History Narrative   Married, father of 2. He is a Scientist, research (physical sciences). He exercises routinely about 3 days a week for about 60 minutes a day. Does not smoke does not drink. He has although the weight due to less frequent exercise and dietary modification.   He has a PhD in Animal nutritionist; but currently works as a Oceanographer.  -- His sister recently passed away.  Is now trying to figure out the care for his mother in [place].   Family History  Problem Relation Age of Onset  . Coronary artery disease Father 57    MI at 51y/o - died  . Hyperlipidemia Mother   . Heart Problems Maternal Grandmother   . Pneumonia Maternal Grandfather     Wt Readings from Last 3 Encounters:  04/17/15 218 lb (98.884 kg)  03/12/14 241 lb (109.317 kg)  06/21/13 229 lb 8 oz (104.101 kg)  Significant weight loss since last visit  PHYSICAL EXAM BP 100/80 mmHg  Pulse 74  Ht 6\' 1"  (1.854 m)  Wt 218 lb (98.884 kg)  BMI 28.77 kg/m2 General appearance: alert Oriented x3. Pleasant mood and affect.,  cooperative, appears stated age, no distress and no longer obese HEENT: [place]/AT, EOMI, MMM, anicteric sclera Neck: no adenopathy, no carotid bruit, no JVD and supple, symmetrical, trachea midline Lungs: CTA B., normal percussion bilaterally and Nonlabored, and a air movement Heart: RRR, S1, S2 normal, no murmur, click, rub or gallop and normal apical impulse Abdomen: soft, non-tender; bowel sounds normal; no masses, no organomegaly Extremities: extremities normal, atraumatic, no cyanosis or edema Pulses: 2+ and symmetric Neurologic: Grossly normal   Adult ECG Report 74 bpm, normal sinus rhythm. Inferior infarct, age undetermined. Otherwise normal axis, intervals and durations. No change from last check.   Other studies Reviewed: Additional studies/ records that were reviewed today include:  Recent Labs:  Not in > 1 yr. Due for check   ASSESSMENT / PLAN: Problem List Items Addressed This Visit    Syncope and collapse    No further episodes, however I'm a little bit leery of his blood pressure being as low as it is. I'm therefore converting him from carvedilol to Toprol.      Relevant Medications   Pitavastatin Calcium (LIVALO) 2 MG TABS   metoprolol succinate (TOPROL XL) 25 MG 24 hr tablet   ST elevation myocardial infarction (STEMI) of inferior wall -  PCI to RCA with staged PCI to LAD lesion (Chronic)    He did very well post MI. Preserved EF. No scar shown on follow-up stress test. He did have staged PCI to the LAD, but has not had any further angina or heart failure symptoms.      Relevant Medications   Pitavastatin Calcium (LIVALO) 2 MG TABS   metoprolol succinate (TOPROL XL) 25 MG 24 hr tablet   Obesity (BMI 30-39.9) (Chronic)    He has lost enough weight now that he does not meet the "obesity " criteria. I congratulated him on his efforts and recommended continued exercise with dietary modification..      Metabolic syndrome, obese, elevated glucose, Hgb A1c 6.1 (Chronic)      I'm not sure what his glycemic control is currently, we will see with chem panel. I would imagine with his weight loss that his glycemic control as well as lipid control has notably improved. Hopefully this will take him  out of the "metabolic syndrome criteria"      Hyperlipidemia with target LDL less than 70 (Chronic)    He did have good improvement after we switched him to Livalo. Tolerating it well with no memory issues. We'll switch his Livalo prescription to 90 days. He is due for a lipid panel and chemistry panel for follow-up.      Relevant Medications   Pitavastatin Calcium (LIVALO) 2 MG TABS   metoprolol succinate (TOPROL XL) 25 MG 24 hr tablet   Other Relevant Orders   EKG 12-Lead   Lipid panel   Comprehensive metabolic panel   Essential hypertension (Chronic)    Blood pressure has been much better controlled now he has lost weight. As expected, we now have an issue with his blood pressure being too low. Converting from carvedilol to Toprol at lower dose (25 mg)      Relevant Medications   Pitavastatin Calcium (LIVALO) 2 MG TABS   metoprolol succinate (TOPROL XL) 25 MG 24 hr tablet   Other Relevant Orders   EKG 12-Lead   Lipid panel   Comprehensive metabolic panel   CAD S/P percutaneous coronary angioplasty: PCI RCA during STEMI, staged PCI to LAD after positive stress test one month post stenting - Primary (Chronic)    He is now 3-1/2 years out from his PCI. He feels very quite well. No recurrent symptoms. I think at this point we can probably stop Effient and switch to Plavix without aspirin. He has no stent that I would prefer for and to be on Thienopyridine. He is on carvedilol, but his blood pressure is just a little bit low, so I plan to switch him to Toprol 25 mg. He is also on Livalo as his statin. Finally tolerating it without any memory issues.      Relevant Medications   Pitavastatin Calcium (LIVALO) 2 MG TABS   metoprolol succinate (TOPROL XL) 25 MG 24 hr  tablet   Other Relevant Orders   EKG 12-Lead   Lipid panel   Comprehensive metabolic panel    Other Visit Diagnoses    Drug therapy        Relevant Orders    Lipid panel    Comprehensive metabolic panel       Current medicines are reviewed at length with the patient today. (+/- concerns) ? How long will he be on Effient. The following changes have been made:  Stop effient   Start clopidogrel 75 mg one tablet daily   stop carvedilol Start metoprolol succinate ER 25 MG TAKE AT BEDTIME.  LABS CMP,LIPID - NOTHING TO EAT OR DRINK THE MORNING  Your physician wants you to follow-up in 12 MONTHS WITH DR Anavey Coombes.---30 MIN   Studies Ordered:   Orders Placed This Encounter  Procedures  . Lipid panel  . Comprehensive metabolic panel  . EKG 12-Lead      [name], M.D., M.S. Interventional Cardiologist   Pager # [phone] Phone # [phone] [address] [place], [place] [address]

## 2015-04-17 NOTE — Patient Instructions (Signed)
Stop effient   Start clopidogrel 75 mg one tablet daily   stop carvedilol Start metoprolol succinate ER 25 MG TAKE AT BEDTIME.  LABS CMP,LIPID - NOTHING TO EAT OR DRINK THE MORNING  Your physician wants you to follow-up in 12 MONTHS WITH DR HARDING.---30 MIN  You will receive a reminder letter in the mail two months in advance. If you don't receive a letter, please call our office to schedule the follow-up appointment.  If you need a refill on your cardiac medications before your next appointment, please call your pharmacy.

## 2015-04-19 ENCOUNTER — Encounter: Payer: Self-pay | Admitting: Cardiology

## 2015-04-19 NOTE — Assessment & Plan Note (Signed)
He did very well post MI. Preserved EF. No scar shown on follow-up stress test. He did have staged PCI to the LAD, but has not had any further angina or heart failure symptoms.

## 2015-04-19 NOTE — Assessment & Plan Note (Signed)
He is now 3-1/2 years out from his PCI. He feels very quite well. No recurrent symptoms. I think at this point we can probably stop Effient and switch to Plavix without aspirin. He has no stent that I would prefer for and to be on Thienopyridine. He is on carvedilol, but his blood pressure is just a little bit low, so I plan to switch him to Toprol 25 mg. He is also on Livalo as his statin. Finally tolerating it without any memory issues.

## 2015-04-19 NOTE — Assessment & Plan Note (Signed)
Blood pressure has been much better controlled now he has lost weight. As expected, we now have an issue with his blood pressure being too low. Converting from carvedilol to Toprol at lower dose (25 mg)

## 2015-04-19 NOTE — Assessment & Plan Note (Signed)
I'm not sure what his glycemic control is currently, we will see with chem panel. I would imagine with his weight loss that his glycemic control as well as lipid control has notably improved. Hopefully this will take him out of the "metabolic syndrome criteria"

## 2015-04-19 NOTE — Assessment & Plan Note (Signed)
He did have good improvement after we switched him to Livalo. Tolerating it well with no memory issues. We'll switch his Livalo prescription to 90 days. He is due for a lipid panel and chemistry panel for follow-up.

## 2015-04-19 NOTE — Assessment & Plan Note (Signed)
He has lost enough weight now that he does not meet the "obesity " criteria. I congratulated him on his efforts and recommended continued exercise with dietary modification.Marland Kitchen

## 2015-04-19 NOTE — Assessment & Plan Note (Signed)
No further episodes, however I'm a little bit leery of his blood pressure being as low as it is. I'm therefore converting him from carvedilol to Toprol.

## 2015-05-08 LAB — COMPREHENSIVE METABOLIC PANEL
ALK PHOS: 53 U/L (ref 40–115)
ALT: 20 U/L (ref 9–46)
AST: 17 U/L (ref 10–35)
Albumin: 4.2 g/dL (ref 3.6–5.1)
BUN: 16 mg/dL (ref 7–25)
CALCIUM: 9.3 mg/dL (ref 8.6–10.3)
CO2: 27 mmol/L (ref 20–31)
CREATININE: 0.83 mg/dL (ref 0.70–1.33)
Chloride: 100 mmol/L (ref 98–110)
Glucose, Bld: 89 mg/dL (ref 65–99)
Potassium: 4.5 mmol/L (ref 3.5–5.3)
Sodium: 139 mmol/L (ref 135–146)
TOTAL PROTEIN: 6.5 g/dL (ref 6.1–8.1)
Total Bilirubin: 0.6 mg/dL (ref 0.2–1.2)

## 2015-05-08 LAB — LIPID PANEL
CHOLESTEROL: 219 mg/dL — AB (ref 125–200)
HDL: 71 mg/dL (ref >=40)
LDL Cholesterol: 126 mg/dL (ref <130)
Total CHOL/HDL Ratio: 3.1 Ratio (ref <=5.0)
Triglycerides: 109 mg/dL (ref <150)
VLDL: 22 mg/dL (ref <30)

## 2015-05-22 ENCOUNTER — Telehealth: Payer: Self-pay | Admitting: *Deleted

## 2015-05-22 DIAGNOSIS — Z79899 Other long term (current) drug therapy: Secondary | ICD-10-CM

## 2015-05-22 DIAGNOSIS — E785 Hyperlipidemia, unspecified: Secondary | ICD-10-CM

## 2015-05-22 NOTE — Telephone Encounter (Signed)
-----   Message from [name], MD sent at 05/17/2015  1:01 PM EST ----- I'm not sure what happened this time around. But the cholesterol level has gone back up to what was in April 2015 when were taking any medicine. I would've thought with the weight loss that the lipids would be much better controlled. I think he is on 2 mg of Livalo. Less increased to 4 mg daily and recheck lipids in 6 months  HARDING, Piedad Climes, MD  PLS forward to [name], MD [

## 2015-05-22 NOTE — Telephone Encounter (Signed)
Spoke to patient. Result given . Verbalized understanding PATIENT STATES HE HAS NOT FOLLOWED A CONSISTENT DIET IN SEVERAL MONTHS PATIENT WOULD LIKE TO DIET AND EXERCISE FOR NOW AND THEN RECHECK LABS IN 6 MONTHS RN INFORMED WILL MAIL LAB SLIP IN 6 MONTHS     FORWARD TO DR Rocco Pauls

## 2015-07-18 ENCOUNTER — Telehealth: Payer: Self-pay | Admitting: *Deleted

## 2015-07-18 NOTE — Telephone Encounter (Signed)
-----   Message from Tobin Chad, RN sent at 05/22/2015 12:32 PM EDT ----- LABS DONE  BY SEPT 16, 2017  MAIL IN AUG 2017 LIPIDS ONLY

## 2015-07-18 NOTE — Telephone Encounter (Signed)
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

## 2015-07-24 ENCOUNTER — Other Ambulatory Visit: Payer: Self-pay

## 2015-07-24 MED ORDER — PITAVASTATIN CALCIUM 2 MG PO TABS: ORAL_TABLET | ORAL | Status: DC

## 2015-07-24 NOTE — Telephone Encounter (Signed)
Patient called to follow up on PA Will forward to Antionette Fairy RN

## 2015-07-28 MED ORDER — PITAVASTATIN CALCIUM 2 MG PO TABS: 2.0000 mg | ORAL_TABLET | Freq: Every day | ORAL | Status: DC

## 2015-07-28 NOTE — Telephone Encounter (Signed)
CALLED TO FIND OUT ON STATUS OF PRIOR AUTHORIZATION  REPRESENTATIVE STATES PRIOR WAS DENIED DUE TO INFORMATION NOT COMPLETED  RN INFORMED REPRESENTATIVE - INFORMATION WAS GIVEN ON 07/21/15   AUTHORIZATION RE -PROCESSED , SHOULD GET ANSWER IN 24-48 HOURS BY FAX.  PATIENT AWARE WILL PICK SAMPLES

## 2015-07-30 NOTE — Telephone Encounter (Signed)
PATIENT NOTIFIED  MEDICATION-LIVALO HAS BEEN APPROVED FOR 1 YEAR

## 2015-11-07 ENCOUNTER — Telehealth: Payer: Self-pay | Admitting: *Deleted

## 2015-11-07 DIAGNOSIS — Z79899 Other long term (current) drug therapy: Secondary | ICD-10-CM

## 2015-11-07 DIAGNOSIS — E785 Hyperlipidemia, unspecified: Secondary | ICD-10-CM

## 2015-11-07 NOTE — Telephone Encounter (Signed)
-----   Message from Tobin Chad, RN sent at 05/22/2015 12:32 PM EDT ----- LABS DONE  BY SEPT 16, 2017  MAIL IN AUG 2017 LIPIDS ONLY

## 2015-11-07 NOTE — Telephone Encounter (Signed)
MAILED LETTER AND LAB SLIP for lipids

## 2015-11-21 DIAGNOSIS — Z79899 Other long term (current) drug therapy: Secondary | ICD-10-CM | POA: Diagnosis not present

## 2015-11-21 DIAGNOSIS — E785 Hyperlipidemia, unspecified: Secondary | ICD-10-CM | POA: Diagnosis not present

## 2015-11-22 LAB — LIPID PANEL
CHOLESTEROL: 183 mg/dL (ref 125–200)
HDL: 50 mg/dL (ref >=40)
LDL Cholesterol: 111 mg/dL (ref <130)
Total CHOL/HDL Ratio: 3.7 Ratio (ref <=5.0)
Triglycerides: 109 mg/dL (ref <150)
VLDL: 22 mg/dL (ref <30)

## 2015-11-26 ENCOUNTER — Telehealth: Payer: Self-pay | Admitting: *Deleted

## 2015-11-26 DIAGNOSIS — I2119 ST elevation (STEMI) myocardial infarction involving other coronary artery of inferior wall: Secondary | ICD-10-CM

## 2015-11-26 DIAGNOSIS — E785 Hyperlipidemia, unspecified: Secondary | ICD-10-CM

## 2015-11-26 DIAGNOSIS — I10 Essential (primary) hypertension: Secondary | ICD-10-CM

## 2015-11-26 NOTE — Telephone Encounter (Signed)
-----   Message from [name], MD sent at 11/22/2015  6:22 PM EDT ----- Cholesterol levels look better, but still not at goal. I would like to see if we can potentially increase Livalo up to 4 mg daily. Recheck before f/u appt.  Bryan Lemma, MD  Pls forward to PCP: Dr. [name].

## 2015-11-26 NOTE — Telephone Encounter (Signed)
LEFT MESSAGE WITH DETAIL INFORMATION AWAITING TO SEE WHICH PHARMACY TO SEND PRESCRIPTION  ROUTED INFORMATION TO PRIMARY

## 2015-11-28 NOTE — Telephone Encounter (Signed)
Spoke to patient   Patient states he would like to dietary management for now and re- test in jan 2018 prior to appointment Patient states he is still having some memory issues Patient aware will mail labslip  jan 2018, okay per Dr [name]

## 2016-01-08 DIAGNOSIS — G4709 Other insomnia: Secondary | ICD-10-CM | POA: Diagnosis not present

## 2016-01-08 DIAGNOSIS — R21 Rash and other nonspecific skin eruption: Secondary | ICD-10-CM | POA: Diagnosis not present

## 2016-01-08 DIAGNOSIS — F419 Anxiety disorder, unspecified: Secondary | ICD-10-CM | POA: Diagnosis not present

## 2016-03-04 ENCOUNTER — Telehealth: Payer: Self-pay | Admitting: *Deleted

## 2016-03-04 DIAGNOSIS — I10 Essential (primary) hypertension: Secondary | ICD-10-CM

## 2016-03-04 DIAGNOSIS — I2119 ST elevation (STEMI) myocardial infarction involving other coronary artery of inferior wall: Secondary | ICD-10-CM

## 2016-03-04 DIAGNOSIS — E785 Hyperlipidemia, unspecified: Secondary | ICD-10-CM

## 2016-03-04 NOTE — Telephone Encounter (Signed)
-----   Message from Tobin Chad, RN sent at 11/28/2015  5:23 PM EDT ----- Need labs by 03/29/16 cmp,lipid  Mail in 02/23/16

## 2016-03-04 NOTE — Telephone Encounter (Signed)
Mailed letter and labslip 

## 2016-04-07 ENCOUNTER — Other Ambulatory Visit: Payer: Self-pay | Admitting: Cardiology

## 2016-04-07 NOTE — Telephone Encounter (Signed)
Rx(s) sent to pharmacy electronically.  

## 2016-04-26 ENCOUNTER — Other Ambulatory Visit: Payer: Self-pay

## 2016-04-26 MED ORDER — NITROGLYCERIN 0.4 MG SL SUBL
0.4000 mg | SUBLINGUAL_TABLET | SUBLINGUAL | 3 refills | Status: AC | PRN
Start: 2016-04-26 — End: ?

## 2016-05-10 ENCOUNTER — Other Ambulatory Visit: Payer: Self-pay | Admitting: Cardiology

## 2016-05-11 ENCOUNTER — Other Ambulatory Visit: Payer: Self-pay | Admitting: *Deleted

## 2016-05-11 DIAGNOSIS — I10 Essential (primary) hypertension: Secondary | ICD-10-CM | POA: Diagnosis not present

## 2016-05-11 DIAGNOSIS — E785 Hyperlipidemia, unspecified: Secondary | ICD-10-CM | POA: Diagnosis not present

## 2016-05-11 DIAGNOSIS — I2119 ST elevation (STEMI) myocardial infarction involving other coronary artery of inferior wall: Secondary | ICD-10-CM | POA: Diagnosis not present

## 2016-05-11 MED ORDER — METOPROLOL SUCCINATE ER 25 MG PO TB24: 25.0000 mg | ORAL_TABLET | Freq: Every day | ORAL | 0 refills | Status: DC

## 2016-05-11 MED ORDER — CLOPIDOGREL BISULFATE 75 MG PO TABS: 75.0000 mg | ORAL_TABLET | Freq: Every day | ORAL | 0 refills | Status: DC

## 2016-05-12 LAB — COMPREHENSIVE METABOLIC PANEL
ALT: 17 U/L (ref 9–46)
AST: 17 U/L (ref 10–35)
Albumin: 4.1 g/dL (ref 3.6–5.1)
Alkaline Phosphatase: 48 U/L (ref 40–115)
BUN: 10 mg/dL (ref 7–25)
CHLORIDE: 104 mmol/L (ref 98–110)
CO2: 27 mmol/L (ref 20–31)
Calcium: 8.8 mg/dL (ref 8.6–10.3)
Creat: 1.02 mg/dL (ref 0.70–1.25)
GLUCOSE: 95 mg/dL (ref 65–99)
POTASSIUM: 4.3 mmol/L (ref 3.5–5.3)
Sodium: 140 mmol/L (ref 135–146)
TOTAL PROTEIN: 6.1 g/dL (ref 6.1–8.1)
Total Bilirubin: 0.5 mg/dL (ref 0.2–1.2)

## 2016-05-12 LAB — LIPID PANEL
CHOL/HDL RATIO: 4.9 ratio (ref <5.0)
Cholesterol: 193 mg/dL (ref <200)
HDL: 39 mg/dL — AB (ref >40)
LDL CALC: 120 mg/dL — AB (ref <100)
Triglycerides: 169 mg/dL — ABNORMAL HIGH (ref <150)
VLDL: 34 mg/dL — AB (ref <30)

## 2016-05-14 ENCOUNTER — Ambulatory Visit (INDEPENDENT_AMBULATORY_CARE_PROVIDER_SITE_OTHER): Payer: BLUE CROSS/BLUE SHIELD | Admitting: Cardiology

## 2016-05-14 ENCOUNTER — Encounter: Payer: Self-pay | Admitting: Cardiology

## 2016-05-14 VITALS — BP 116/83 | HR 76 | Ht 73.0 in | Wt 235.8 lb

## 2016-05-14 DIAGNOSIS — E669 Obesity, unspecified: Secondary | ICD-10-CM | POA: Diagnosis not present

## 2016-05-14 DIAGNOSIS — E785 Hyperlipidemia, unspecified: Secondary | ICD-10-CM | POA: Diagnosis not present

## 2016-05-14 DIAGNOSIS — R55 Syncope and collapse: Secondary | ICD-10-CM

## 2016-05-14 DIAGNOSIS — Z79899 Other long term (current) drug therapy: Secondary | ICD-10-CM | POA: Diagnosis not present

## 2016-05-14 DIAGNOSIS — I10 Essential (primary) hypertension: Secondary | ICD-10-CM

## 2016-05-14 DIAGNOSIS — Z9861 Coronary angioplasty status: Secondary | ICD-10-CM

## 2016-05-14 DIAGNOSIS — I2119 ST elevation (STEMI) myocardial infarction involving other coronary artery of inferior wall: Secondary | ICD-10-CM | POA: Diagnosis not present

## 2016-05-14 DIAGNOSIS — I2510 Atherosclerotic heart disease of native coronary artery without angina pectoris: Secondary | ICD-10-CM | POA: Diagnosis not present

## 2016-05-14 MED ORDER — CLOPIDOGREL BISULFATE 75 MG PO TABS: 75.0000 mg | ORAL_TABLET | Freq: Every day | ORAL | 3 refills | Status: DC

## 2016-05-14 MED ORDER — METOPROLOL SUCCINATE ER 25 MG PO TB24: 25.0000 mg | ORAL_TABLET | Freq: Every day | ORAL | 3 refills | Status: DC

## 2016-05-14 MED ORDER — PITAVASTATIN CALCIUM 4 MG PO TABS: 4.0000 mg | ORAL_TABLET | Freq: Every evening | ORAL | 3 refills | Status: DC

## 2016-05-14 NOTE — Patient Instructions (Signed)
Medications change  Increase Livalo 4 mg - TAKE ONE TABLET  DAILY  IN THE EVENING.    LABS CMP -IN 6 MONTHS - WILL MAIL YOU THE LAB SLIP

## 2016-05-14 NOTE — Progress Notes (Signed)
PCP: [name], MD  Clinic Note: Chief Complaint  Patient presents with  . Follow-up    CAD, inferior MI status post PCI to RCA and LAD    HPI: Jason Mathis is a 61 y.o. male with a PMH below who presents today for annual follow-up of CAD with a history of  Inferior STEMI in May 2013 with occluded RCA. He then had states follow-up PCI to the LAD for abnormal Myoview post MI. Can't.  Jason Mathis was last seen on February 2017. He was doing very well. He was both happy and sad. His sister recently died, but he does have a grandson. He was exercising routinely and wheezing packing as a exercise.  Recent Hospitalizations: none  Studies Reviewed: None  Interval History: Jason Mathis presents today doing quite well fine with no recurrent symptoms of anginal chest pain or dyspnea with rest or exertion. No resting, he is a little bit frustrated with himself about having gained back weight and not getting into his exercise. His major means of exercise has been kayaking, and with the weather being cold he has not done that.  From a cardiac standpoint he is doing well without any of his anginal or dyspnea symptoms with rest or exertion. No PND, orthopnea or edema. No sensation of rapid irregular heartbeats or palpitations.   No syncope/near syncope, or TIA/amaurosis fugax symptoms. No claudication.  ROS: A comprehensive was performed. Review of Systems  Constitutional: Negative for malaise/fatigue and weight loss.  HENT: Negative.   Respiratory: Negative for cough, shortness of breath and wheezing.   Cardiovascular: Negative.        Per history of present illness  Gastrointestinal: Negative for blood in stool and melena.  Genitourinary: Negative for hematuria.  Musculoskeletal: Negative.  Negative for joint pain.  Neurological: Negative.   Psychiatric/Behavioral: Negative for depression and memory loss. The patient is not nervous/anxious and does not have insomnia.   All other systems  reviewed and are negative.   Past Medical History:  Diagnosis Date  . CAD (coronary artery disease), native coronary artery 07/2011   Additional ~70-80% LAD lesion; Preserved LVEF  . CAD S/P percutaneous coronary angioplasty 09/07/2011   PCI of the LAD - Promus Element DES 4.0x80mm postdilated to 4.35mm  . Chronic bronchitis (HCC)    "used to get it every year til a few years ago" (09/07/11)  . Dyslipidemia, goal LDL below 70 07/15/2011  . H/O echocardiogram 07/15/2011   Normal EF 55-60%; no WMA  . History of nuclear stress test 08/24/2011   bruce protocol; mild ischemia in apical lateral regions; mild perfusion defect due to infarct/scar with mod-severe perinfarct ischemia in basal inferoseptal/basal inferior/mid inferoseptal/mid inferior regions; high risk    . History of pneumonia 2003  . History of ST elevation myocardial infarction (STEMI) of inferior wall 07/15/2011   inferior; PCI of Proximal RCA & LAD  . Hypertension   . Kidney stones   . Metabolic syndrome    Hgb AiC 6.1 -- low HDL, elevated blood glucose, hypertension  . Obesity (BMI 30-39.9)   . Syncope and collapse April 2014   Post-micturition with orthostatic tendencies.    Past Surgical History:  Procedure Laterality Date  . ANAL SPHINCTEROTOMY  1996  . CORONARY ANGIOPLASTY WITH STENT PLACEMENT  07/2011   PCI of RCA with Promus Element 3.0 mm x 16 mm DES; final diameter 3.25 mm  . CORONARY ANGIOPLASTY WITH STENT PLACEMENT  09/07/2011   PCI of LAD with Promus Element DES  4.0x6mm postdilated to 4.63mm  . KNEE ARTHROSCOPY  1999; 9/20099   r/t left knee meniscal tear  . LEFT HEART CATHETERIZATION WITH CORONARY ANGIOGRAM N/A 07/14/2011   Procedure: LEFT HEART CATHETERIZATION WITH CORONARY ANGIOGRAM;  Surgeon: [name], MD;  Location: [facility] CATH LAB;  Service: Cardiovascular;  Laterality: N/A;  . LEFT HEART CATHETERIZATION WITH CORONARY ANGIOGRAM N/A 09/07/2011   Procedure: LEFT HEART CATHETERIZATION WITH CORONARY ANGIOGRAM;   Surgeon: [name], MD;  Location: [facility] CATH LAB;  Service: Cardiovascular;  Laterality: N/A;  . LITHOTRIPSY  1999  . PERCUTANEOUS CORONARY STENT INTERVENTION (PCI-S)  07/14/2011   Procedure: PERCUTANEOUS CORONARY STENT INTERVENTION (PCI-S);  Surgeon: [name], MD;  Location: [facility] CATH LAB;  Service: Cardiovascular;;  . PERCUTANEOUS CORONARY STENT INTERVENTION (PCI-S) Right 09/07/2011   Procedure: PERCUTANEOUS CORONARY STENT INTERVENTION (PCI-S);  Surgeon: [name], MD;  Location: [facility] CATH LAB;  Service: Cardiovascular;  Laterality: Right;  . TONSILLECTOMY  1968  . TRANSTHORACIC ECHOCARDIOGRAM  07/15/2011   EF 55-60%    Current Meds  Medication Sig  . ALPRAZolam (XANAX) 0.25 MG tablet 0.25 mg at bedtime as needed.  . clopidogrel (PLAVIX) 75 MG tablet Take 1 tablet (75 mg total) by mouth daily.  . cyproheptadine (PERIACTIN) 4 MG tablet Take 4 mg by mouth daily.  Marland Kitchen FLUoxetine (PROZAC) 40 MG capsule 40 mg once.  . metoprolol succinate (TOPROL-XL) 25 MG 24 hr tablet Take 1 tablet (25 mg total) by mouth daily.  Marland Kitchen NAPROXEN PO Take 1-3 tablets by mouth every 12 (twelve) hours as needed (back pain).  . nitroGLYCERIN (NITROSTAT) 0.4 MG SL tablet Place 1 tablet (0.4 mg total) under the tongue every 5 (five) minutes as needed for chest pain.  . [DISCONTINUED] clopidogrel (PLAVIX) 75 MG tablet Take 1 tablet (75 mg total) by mouth daily.  . [DISCONTINUED] metoprolol succinate (TOPROL-XL) 25 MG 24 hr tablet Take 1 tablet (25 mg total) by mouth daily.  . [DISCONTINUED] Pitavastatin Calcium (LIVALO) 2 MG TABS TAKE 1 TABLET (2 MG TOTAL) BY MOUTH DAILY AFTER SUPPER.    Allergies  Allergen Reactions  . Atorvastatin     Memory issues  . Crestor [Rosuvastatin]     Memory issues   . Pravastatin Other (See Comments)    Memory issues  . Zetia [Ezetimibe]     Myalgias     Social History   Social History  . Marital status: Married    Spouse name: N/A  . Number of children: 2  . Years of  education: PhD.    Occupational History  .  [employer]   Social History Main Topics  . Smoking status: Never Smoker  . Smokeless tobacco: Former Neurosurgeon    Types: Chew    Quit date: 03/09/2003  . Alcohol use 4.2 oz/week    7 Glasses of wine per week     Comment: 11/28/2012-occasional glass of wine, 09/07/11 "nothing since MI 07/14/11"  . Drug use: No  . Sexual activity: Yes   Other Topics Concern  . None   Social History Narrative   Married, father of 2. He is a Scientist, research (physical sciences). He exercises routinely about 3 days a week for about 60 minutes a day. Does not smoke does not drink. He has although the weight due to less frequent exercise and dietary modification.   He has a PhD in Animal nutritionist; but currently works as a Oceanographer.     family history includes Coronary artery disease (age of onset: 38)  in his father; Heart Problems in his maternal grandmother; Hyperlipidemia in his mother; Pneumonia in his maternal grandfather.  Wt Readings from Last 3 Encounters:  05/14/16 107 kg (235 lb 12.8 oz)  04/17/15 98.9 kg (218 lb)  03/12/14 109.3 kg (241 lb)    PHYSICAL EXAM BP 116/83 (BP Location: Right Arm)   Pulse 76   Ht 6\' 1"  (1.854 m)   Wt 107 kg (235 lb 12.8 oz)   BMI 31.11 kg/m  General appearance: alert Oriented x3. Pleasant mood and affect., cooperative, appears stated age, no distress and no longer obese HEENT: [place]/AT, EOMI, MMM, anicteric sclera Neck: no adenopathy, no carotid bruit, no JVD and supple, symmetrical, trachea midline Lungs: CTA B., normal percussion bilaterally and Nonlabored, and a air movement Heart: RRR, S1, S2 normal, no murmur, click, rub or gallop and normal apical impulse Abdomen: soft, non-tender; bowel sounds normal; no masses, no organomegaly Extremities: extremities normal, atraumatic, no cyanosis or edema Pulses: 2+ and symmetric Neurologic: Grossly normal    Adult ECG Report  Rate: 76 ;  Rhythm: normal sinus rhythm and Normal  axis, intervals and durations. Inferior MI, age undetermined. Borderline nonspecific ST and T-wave changes;   Narrative Interpretation: stable EKG   Other studies Reviewed: Additional studies/ records that were reviewed today include:  Recent Labs:    Lab Results  Component Value Date   CHOL 193 05/11/2016   HDL 39 (L) 05/11/2016   LDLCALC 120 (H) 05/11/2016   TRIG 169 (H) 05/11/2016   CHOLHDL 4.9 05/11/2016     ASSESSMENT / PLAN: Problem List Items Addressed This Visit    CAD S/P percutaneous coronary angioplasty: PCI RCA during STEMI, staged PCI to LAD after positive stress test one month post stenting - Primary (Chronic)    For half years out from PCI. Doing very well. We switched him to Plavix from Effient for maintenance. Okay to hold for procedures. We switched to metoprolol because of low blood pressures and is doing much better now. As result, I am not start him on ACE inhibitor or ARB. He is taking Livalo which we are adjusting up to 4 mg daily. No further memory issues with Livalo - he had memory issues with Lipitor and Crestor.      Relevant Medications   metoprolol succinate (TOPROL-XL) 25 MG 24 hr tablet   Pitavastatin Calcium 4 MG TABS   Other Relevant Orders   EKG 12-Lead   Essential hypertension (Chronic)    Really not much can issues since he lost weight. We converted from carvedilol Toprol last time I saw him. Blood pressures are stable.      Relevant Medications   metoprolol succinate (TOPROL-XL) 25 MG 24 hr tablet   Pitavastatin Calcium 4 MG TABS   Other Relevant Orders   EKG 12-Lead   Hyperlipidemia with target LDL less than 70 (Chronic)    Sort of took a step back of late despite being on his current dose of Livalo. He has not been doing his part as far as diet and exercise of late however. Plan for now would increase to 4 mg of Livalo, he will get back in diet As. We will recheck his lipids in 6 months, and if not at goal, would consider referral to  [facility] clinic to consider PCSK9-I  (he also had side effects from Zetia)      Relevant Medications   metoprolol succinate (TOPROL-XL) 25 MG 24 hr tablet   Pitavastatin Calcium 4 MG TABS  Other Relevant Orders   EKG 12-Lead   Lipid panel   Comprehensive metabolic panel   Obesity (BMI 19-14.7) (Chronic)    Gained back the weight he previously lost - got out of the exercise habit. We discussed continued dietary modification & restarting exercise.      Relevant Orders   EKG 12-Lead   Lipid panel   Comprehensive metabolic panel   ST elevation myocardial infarction (STEMI) of inferior wall -  PCI to RCA with staged PCI to LAD lesion (Chronic)    He did very well following his MI and preserved EF with no scar on follow-up stress test. Followed up by staged PCI to the LAD. No recurrent anginal or heart failure symptoms. He is on good stable regimen, not requiring much in the way of blood pressure control.      Relevant Medications   metoprolol succinate (TOPROL-XL) 25 MG 24 hr tablet   Pitavastatin Calcium 4 MG TABS   Other Relevant Orders   EKG 12-Lead   Syncope and collapse    Probably related to orthostatic hypotension with a vasovagal component. No further episodes since switching to Toprol. As result, would not further titrate it to hypertensive regimen unless he shows signs of persistent hypertension.      Relevant Medications   metoprolol succinate (TOPROL-XL) 25 MG 24 hr tablet   Pitavastatin Calcium 4 MG TABS    Other Visit Diagnoses    Medication management       Relevant Orders   Lipid panel   Comprehensive metabolic panel      Current medicines are reviewed at length with the patient today. (+/- concerns) None  The following changes have been made -- increased Livalo 24 mg  Patient Instructions  Medications change  Increase Livalo 4 mg - TAKE ONE TABLET  DAILY  IN THE EVENING.    LABS CMP -IN 6 MONTHS - WILL MAIL YOU THE LAB SLIP   Studies Ordered:     Orders Placed This Encounter  Procedures  . Lipid panel  . Comprehensive metabolic panel  . EKG 12-Lead      Bryan Lemma, M.D., M.S. Interventional Cardiologist   Pager # [phone] Phone # [phone] [address] [place], [place] [address]

## 2016-05-16 ENCOUNTER — Encounter: Payer: Self-pay | Admitting: Cardiology

## 2016-05-16 NOTE — Assessment & Plan Note (Signed)
Really not much can issues since he lost weight. We converted from carvedilol Toprol last time I saw him. Blood pressures are stable.

## 2016-05-16 NOTE — Assessment & Plan Note (Signed)
Probably related to orthostatic hypotension with a vasovagal component. No further episodes since switching to Toprol. As result, would not further titrate it to hypertensive regimen unless he shows signs of persistent hypertension.

## 2016-05-16 NOTE — Assessment & Plan Note (Signed)
Gained back the weight he previously lost - got out of the exercise habit. We discussed continued dietary modification & restarting exercise.

## 2016-05-16 NOTE — Assessment & Plan Note (Signed)
For half years out from PCI. Doing very well. We switched him to Plavix from Effient for maintenance. Okay to hold for procedures. We switched to metoprolol because of low blood pressures and is doing much better now. As result, I am not start him on ACE inhibitor or ARB. He is taking Livalo which we are adjusting up to 4 mg daily. No further memory issues with Livalo - he had memory issues with Lipitor and Crestor.

## 2016-05-16 NOTE — Assessment & Plan Note (Signed)
Sort of took a step back of late despite being on his current dose of Livalo. He has not been doing his part as far as diet and exercise of late however. Plan for now would increase to 4 mg of Livalo, he will get back in diet As. We will recheck his lipids in 6 months, and if not at goal, would consider referral to [facility] clinic to consider PCSK9-I  (he also had side effects from Zetia)

## 2016-05-16 NOTE — Assessment & Plan Note (Signed)
He did very well following his MI and preserved EF with no scar on follow-up stress test. Followed up by staged PCI to the LAD. No recurrent anginal or heart failure symptoms. He is on good stable regimen, not requiring much in the way of blood pressure control.

## 2016-07-21 ENCOUNTER — Other Ambulatory Visit: Payer: Self-pay | Admitting: Cardiology

## 2016-08-04 DIAGNOSIS — J069 Acute upper respiratory infection, unspecified: Secondary | ICD-10-CM | POA: Diagnosis not present

## 2016-08-16 ENCOUNTER — Other Ambulatory Visit: Payer: Self-pay

## 2016-08-16 MED ORDER — PITAVASTATIN CALCIUM 4 MG PO TABS: 4.0000 mg | ORAL_TABLET | Freq: Every evening | ORAL | 3 refills | Status: DC

## 2016-09-10 ENCOUNTER — Telehealth: Payer: Self-pay | Admitting: *Deleted

## 2016-09-10 NOTE — Telephone Encounter (Signed)
[employer] - PRIOR AUTHORIZATION STARTED FOR LIVALO 4 MG   PA-CASE ID [id number] KEY #[id number]   DX USED----E78.5  HYPERLIPIDEMIA W/TRGET LDL >70           I21.9  STEMI OF INFERIOR WALL           I25.0 PATIENT TRIED ATORVASTATIN 2014, ROUVASTATIN 2014, PRAVASTATIN 2016, FENOFIBRATE 2015 ---STOPPED DUE MEMORY ISSUES ,MYALGIAS   AWAITING ANSWER

## 2017-06-07 ENCOUNTER — Ambulatory Visit: Payer: BLUE CROSS/BLUE SHIELD | Admitting: Cardiology

## 2017-06-07 ENCOUNTER — Encounter: Payer: Self-pay | Admitting: Cardiology

## 2017-06-07 VITALS — BP 116/84 | HR 72 | Ht 74.0 in | Wt 247.0 lb

## 2017-06-07 DIAGNOSIS — Z9861 Coronary angioplasty status: Secondary | ICD-10-CM

## 2017-06-07 DIAGNOSIS — E669 Obesity, unspecified: Secondary | ICD-10-CM

## 2017-06-07 DIAGNOSIS — E785 Hyperlipidemia, unspecified: Secondary | ICD-10-CM | POA: Diagnosis not present

## 2017-06-07 DIAGNOSIS — I10 Essential (primary) hypertension: Secondary | ICD-10-CM

## 2017-06-07 DIAGNOSIS — I2119 ST elevation (STEMI) myocardial infarction involving other coronary artery of inferior wall: Secondary | ICD-10-CM | POA: Diagnosis not present

## 2017-06-07 DIAGNOSIS — E8881 Metabolic syndrome: Secondary | ICD-10-CM | POA: Diagnosis not present

## 2017-06-07 DIAGNOSIS — I2510 Atherosclerotic heart disease of native coronary artery without angina pectoris: Secondary | ICD-10-CM | POA: Diagnosis not present

## 2017-06-07 MED ORDER — PITAVASTATIN CALCIUM 4 MG PO TABS: 4.0000 mg | ORAL_TABLET | Freq: Every evening | ORAL | 3 refills | Status: DC

## 2017-06-07 NOTE — Patient Instructions (Signed)
MEDICATION INSTRUCTION  NO CHANGES    Labs  Lipid cmp   Your physician wants you to follow-up in 12 months with DR HARDING.You will receive a reminder letter in the mail two months in advance. If you don't receive a letter, please call our office to schedule the follow-up appointment.

## 2017-06-07 NOTE — Progress Notes (Signed)
PCP: [name], MD  Clinic Note: Chief Complaint  Patient presents with  . Follow-up    12 months  . Coronary Artery Disease    HPI: Jason Mathis is a 62 y.o. male with a PMH below who presents today for annual f/u for CAD-PCI & HLD>. .Inferior STEMI in May 2013 with occluded RCA. He then had Staged PCI-LAD for abnormal Myoview post MI.  Kebron Sassone was last seen in March 2018 - doing quite well fine with no recurrent symptoms of anginal chest pain or dyspnea with rest or exertion. No resting, he is a little bit frustrated with himself about having gained back weight and not getting into his exercise. His major means of exercise has been kayaking, and with the weather being cold he has not done that.  Recent Hospitalizations: none  Studies Personally Reviewed - (if available, images/films reviewed: From Epic Chart or Care Everywhere)  none  Interval History: Ziaire returns today doing well as usual.  Unfortunately, he pulled a muscle in his arm about 4 months ago and is not really fully recuperated.  As result he is not been able to do his routine kayaking exercises.  As a result, he thinks he may have gained a little bit of weight and his lipids may be a little off.Marland Kitchen  Despite that, with his routine walking type of activity, he denies any chest tightness or pressure with rest or exertion.  No exertional dyspnea.  No PND, orthopnea or edema.   No palpitations, lightheadedness, dizziness, weakness or syncope/near syncope. No TIA/amaurosis fugax symptoms. No claudication.  ROS: A comprehensive was performed. Review of Systems  Constitutional: Negative for malaise/fatigue.  HENT: Negative for congestion and nosebleeds.   Respiratory: Negative for cough, shortness of breath and wheezing.   Gastrointestinal: Negative for blood in stool and melena.  Genitourinary: Negative for hematuria.  Musculoskeletal: Negative for falls and joint pain.       Left arm and back soreness from his  injury.  Neurological: Negative for dizziness and weakness.  Endo/Heme/Allergies: Negative for environmental allergies. Does not bruise/bleed easily.  Psychiatric/Behavioral: Negative for memory loss. The patient is not nervous/anxious and does not have insomnia.   All other systems reviewed and are negative.  I have reviewed and (if needed) personally updated the patient's problem list, medications, allergies, past medical and surgical history, social and family history.   Past Medical History:  Diagnosis Date  . CAD (coronary artery disease), native coronary artery 07/2011   Additional ~70-80% LAD lesion; Preserved LVEF  . CAD S/P percutaneous coronary angioplasty 09/07/2011   PCI of the LAD - Promus Element DES 4.0x6mm postdilated to 4.61mm  . Chronic bronchitis (HCC)    "used to get it every year til a few years ago" (09/07/11)  . Dyslipidemia, goal LDL below 70 07/15/2011  . H/O echocardiogram 07/15/2011   Normal EF 55-60%; no WMA  . History of nuclear stress test 08/24/2011   bruce protocol; mild ischemia in apical lateral regions; mild perfusion defect due to infarct/scar with mod-severe perinfarct ischemia in basal inferoseptal/basal inferior/mid inferoseptal/mid inferior regions; high risk    . History of pneumonia 2003  . History of ST elevation myocardial infarction (STEMI) of inferior wall 07/15/2011   inferior; PCI of Proximal RCA & LAD  . Hypertension   . Kidney stones   . Metabolic syndrome    Hgb AiC 6.1 -- low HDL, elevated blood glucose, hypertension  . Obesity (BMI 30-39.9)   . Syncope and collapse  April 2014   Post-micturition with orthostatic tendencies.    Past Surgical History:  Procedure Laterality Date  . ANAL SPHINCTEROTOMY  1996  . KNEE ARTHROSCOPY  1999; 9/20099   r/t left knee meniscal tear  . LEFT HEART CATHETERIZATION WITH CORONARY ANGIOGRAM N/A 07/14/2011   Procedure: LEFT HEART CATHETERIZATION WITH CORONARY ANGIOGRAM;  Surgeon: [name], MD;   Location: [facility] CATH LAB;; Inf STEMI: 100% pRCA (PCI) -> 30% mid - RPDA & RPL1, 80% pLDA-60% (@ D1 that is med caliber bifurcating vessel ~20%), patent large RI & non-dom Cx-Lat OM.  Marland Kitchen LEFT HEART CATHETERIZATION WITH CORONARY ANGIOGRAM N/A 09/07/2011   Procedure: LEFT HEART CATHETERIZATION WITH CORONARY ANGIOGRAM;  Surgeon: [name], MD;  Location: [facility] CATH LAB;; widely patent RCA stent with 30% mid disease.  Persistent LAD roughly 80%, 60% lesions surrounding trifurcation into the large D1 and S/P 1 ([place] 1,0,1-with minimal involvement of the diagonal branch.)  FFR highly positive.  Widely patent RI  & Cx-LatOM  . LITHOTRIPSY  1999  . PERCUTANEOUS CORONARY STENT INTERVENTION (PCI-S)  07/14/2011   Procedure: PERCUTANEOUS CORONARY STENT INTERVENTION (PCI-S);  Surgeon: [name], MD;  Location: [facility] CATH LAB;;; INF STEMI --> PCI of 100% RCA with Promus Element 3.0 mm x 16 mm DES; final diameter 3.25 mm  . PERCUTANEOUS CORONARY STENT INTERVENTION (PCI-S) Right 09/07/2011   Procedure: PERCUTANEOUS CORONARY STENT INTERVENTION (PCI-S);  Surgeon: [name], MD;  Location: [facility] CATH LAB;; Staged PCI of  p-mLAD with Promus Element DES 4.0x37mm postdilated to 4.56mm (Abn Myoview & highly + FFR)  . TONSILLECTOMY  1968  . TRANSTHORACIC ECHOCARDIOGRAM  07/15/2011   EF 55-60%    Current Meds  Medication Sig  . clopidogrel (PLAVIX) 75 MG tablet Take 1 tablet (75 mg total) by mouth daily.  . cyproheptadine (PERIACTIN) 4 MG tablet Take 4 mg by mouth daily.  Marland Kitchen FLUoxetine (PROZAC) 40 MG capsule 40 mg once.  . metoprolol succinate (TOPROL-XL) 25 MG 24 hr tablet Take 1 tablet (25 mg total) by mouth daily.  . montelukast (SINGULAIR) 10 MG tablet Take 10 mg by mouth at bedtime.  . nitroGLYCERIN (NITROSTAT) 0.4 MG SL tablet Place 1 tablet (0.4 mg total) under the tongue every 5 (five) minutes as needed for chest pain.  . Pitavastatin Calcium 4 MG TABS Take 1 tablet (4 mg total) by mouth every evening.  .  [DISCONTINUED] NAPROXEN PO Take 1-3 tablets by mouth every 12 (twelve) hours as needed (back pain).  . [DISCONTINUED] Pitavastatin Calcium 4 MG TABS Take 1 tablet (4 mg total) by mouth every evening.    Allergies  Allergen Reactions  . Atorvastatin     Memory issues  . Crestor [Rosuvastatin]     Memory issues   . Pravastatin Other (See Comments)    Memory issues  . Zetia [Ezetimibe]     Myalgias     Social History   Tobacco Use  . Smoking status: Never Smoker  . Smokeless tobacco: Former Neurosurgeon    Types: Chew  Substance Use Topics  . Alcohol use: Yes    Alcohol/week: 4.2 oz    Types: 7 Glasses of wine per week    Comment: 11/28/2012-occasional glass of wine, 09/07/11 "nothing since MI 07/14/11"  . Drug use: No   Social History   Social History Narrative   Married, father of 2. He is a Scientist, research (physical sciences). He exercises routinely about 3 days a week for about 60 minutes a day. Does  not smoke does not drink. He has although the weight due to less frequent exercise and dietary modification.   He has a PhD in Animal nutritionist; but currently works as a Oceanographer.   He is planning on a trip to [place] the summer spending 2 weeks mostly in the [place], but then going up to the [place] for [employer].  family history includes Coronary artery disease (age of onset: 25) in his father; Heart Problems in his maternal grandmother; Hyperlipidemia in his mother; Pneumonia in his maternal grandfather.  Wt Readings from Last 3 Encounters:  06/07/17 247 lb (112 kg)  05/14/16 235 lb 12.8 oz (107 kg)  04/17/15 218 lb (98.9 kg)    PHYSICAL EXAM BP 116/84 (BP Location: Left Arm, Patient Position: Sitting, Cuff Size: Normal)   Pulse 72   Ht 6\' 2"  (1.88 m)   Wt 247 lb (112 kg)   BMI 31.71 kg/m  Physical Exam  Constitutional: He is oriented to person, place, and time. He appears well-developed and well-nourished. No distress.  Healthy-appearing.  Well-groomed.  He clearly  does seem to have gained weight.  HENT:  Head: Normocephalic and atraumatic.  Neck: No hepatojugular reflux and no JVD present. Carotid bruit is not present.  Cardiovascular: Normal rate, regular rhythm, normal heart sounds and intact distal pulses.  No extrasystoles are present. PMI is not displaced. Exam reveals no gallop and no friction rub.  No murmur heard. Pulmonary/Chest: Effort normal and breath sounds normal. No respiratory distress. He has no wheezes. He has no rales.  Abdominal: Soft. Bowel sounds are normal. He exhibits no distension. There is no tenderness.  Musculoskeletal: Normal range of motion. He exhibits no edema.  Neurological: He is alert and oriented to person, place, and time.  Psychiatric: He has a normal mood and affect. His behavior is normal. Judgment and thought content normal.  Vitals reviewed.    Adult ECG Report  Rate: 72 ;  Rhythm: normal sinus rhythm and Inferior MI, age undetermined.  Otherwise normal axis, intervals and durations.;   Narrative Interpretation: Stable EKG   Other studies Reviewed: Additional studies/ records that were reviewed today include:  Recent Labs: Last set of lipids were from March 2018.  LDL was 120.  Total cholesterol 193.   ASSESSMENT / PLAN: Problem List Items Addressed This Visit    ST elevation myocardial infarction (STEMI) of inferior wall -  PCI to RCA with staged PCI to LAD lesion (Chronic)    Significant inferior MI with LAD disease as well requiring staged PCI.  Preserved EF on echo with no heart failure or angina symptoms now.  Doing quite well.  No change in management.      Relevant Medications   Pitavastatin Calcium 4 MG TABS   Obesity (BMI 30-39.9) (Chronic)   Metabolic syndrome, obese, elevated glucose, Hgb A1c 6.1 (Chronic)    Reevaluate lipids along with chemistry panel to determine glycemic control.  His weight is definitely up and he needs to work on weight loss.  Continue discussed risk factor  modification, but as long as he is remains active I would not further evaluate without symptoms.      Hyperlipidemia with target LDL less than 70 (Chronic)    Unfortunately, his lipids are doing as well as I would like to see.  Initially we had a good response to Livalo and actually increased to 4 mg.  But now I guess since his activity level is gone down and his exercise level is dropped,  he is not doing his own part.  We need to recheck his lipids now and may need to consider referral to [facility] our lipid clinic for possible PCSK9 inhibitor.      Relevant Medications   Pitavastatin Calcium 4 MG TABS   Other Relevant Orders   Lipid panel   Comprehensive metabolic panel   Essential hypertension (Chronic)    Seems to be tolerating Toprol better than carvedilol.  Blood pressure stable.      Relevant Medications   Pitavastatin Calcium 4 MG TABS   CAD S/P -PCI: PCI RCA during STEMI, staged PCI to LAD residual lesion (Abn Nuc ST) - Primary (Chronic)    Now almost after his inferior MI, Frenchie continues to do very well with no further anginal symptoms.  He is not as active as he would like to to be but that is not from a cardiac reason.  This is because of an injury.  Unfortunately has gained weight and his lipids are less controlled than they were. Otherwise his blood pressure looks great and he is not having any symptoms.  Plan: Continue low-dose Toprol and Plavix for now.  (We can consider possibly stopping Plavix at next follow-up). Continue Livalo  Continue to stay active.      Relevant Medications   Pitavastatin Calcium 4 MG TABS   Other Relevant Orders   EKG 12-Lead (Completed)   Comprehensive metabolic panel      I spent a total of with the patient and chart review. >  50% of the time was spent in direct patient consultation.   Current medicines are reviewed at length with the patient today.  (+/- concerns) n/a The following changes have been made:  n/a  Patient  Instructions  MEDICATION INSTRUCTION  NO CHANGES    Labs  Lipid cmp   Your physician wants you to follow-up in 12 months with DR HARDING.You will receive a reminder letter in the mail two months in advance. If you don't receive a letter, please call our office to schedule the follow-up appointment.     Studies Ordered:   Orders Placed This Encounter  Procedures  . Lipid panel  . Comprehensive metabolic panel  . EKG 12-Lead      Bryan Lemma, M.D., M.S. Interventional Cardiologist   Pager # [phone] Phone # [phone] [address] [place], [place] [address]   Thank you for choosing [facility] at [facility]!!

## 2017-06-09 ENCOUNTER — Encounter: Payer: Self-pay | Admitting: Cardiology

## 2017-06-09 NOTE — Assessment & Plan Note (Signed)
Unfortunately, his lipids are doing as well as I would like to see.  Initially we had a good response to Livalo and actually increased to 4 mg.  But now I guess since his activity level is gone down and his exercise level is dropped, he is not doing his own part.  We need to recheck his lipids now and may need to consider referral to [facility] our lipid clinic for possible PCSK9 inhibitor.

## 2017-06-09 NOTE — Assessment & Plan Note (Signed)
Reevaluate lipids along with chemistry panel to determine glycemic control.  His weight is definitely up and he needs to work on weight loss.  Continue discussed risk factor modification, but as long as he is remains active I would not further evaluate without symptoms.

## 2017-06-09 NOTE — Assessment & Plan Note (Signed)
Seems to be tolerating Toprol better than carvedilol.  Blood pressure stable.

## 2017-06-09 NOTE — Assessment & Plan Note (Signed)
Now almost after his inferior MI, Jason Mathis continues to do very well with no further anginal symptoms.  He is not as active as he would like to to be but that is not from a cardiac reason.  This is because of an injury.  Unfortunately has gained weight and his lipids are less controlled than they were. Otherwise his blood pressure looks great and he is not having any symptoms.  Plan: Continue low-dose Toprol and Plavix for now.  (We can consider possibly stopping Plavix at next follow-up). Continue Livalo  Continue to stay active.

## 2017-06-09 NOTE — Assessment & Plan Note (Signed)
Significant inferior MI with LAD disease as well requiring staged PCI.  Preserved EF on echo with no heart failure or angina symptoms now.  Doing quite well.  No change in management.

## 2017-06-26 ENCOUNTER — Other Ambulatory Visit: Payer: Self-pay | Admitting: Cardiology

## 2017-07-13 ENCOUNTER — Observation Stay (HOSPITAL_COMMUNITY)
Admission: EM | Admit: 2017-07-13 | Discharge: 2017-07-15 | Disposition: A | Discharge status: Home / Self Care | Payer: BLUE CROSS/BLUE SHIELD | Attending: Internal Medicine | Admitting: Internal Medicine

## 2017-07-13 ENCOUNTER — Encounter (HOSPITAL_COMMUNITY): Payer: Self-pay

## 2017-07-13 ENCOUNTER — Other Ambulatory Visit: Payer: Self-pay

## 2017-07-13 ENCOUNTER — Emergency Department (HOSPITAL_COMMUNITY): Payer: BLUE CROSS/BLUE SHIELD

## 2017-07-13 DIAGNOSIS — I25118 Atherosclerotic heart disease of native coronary artery with other forms of angina pectoris: Principal | ICD-10-CM | POA: Insufficient documentation

## 2017-07-13 DIAGNOSIS — Z8249 Family history of ischemic heart disease and other diseases of the circulatory system: Secondary | ICD-10-CM | POA: Insufficient documentation

## 2017-07-13 DIAGNOSIS — I2510 Atherosclerotic heart disease of native coronary artery without angina pectoris: Secondary | ICD-10-CM

## 2017-07-13 DIAGNOSIS — R9431 Abnormal electrocardiogram [ECG] [EKG]: Secondary | ICD-10-CM

## 2017-07-13 DIAGNOSIS — Z7982 Long term (current) use of aspirin: Secondary | ICD-10-CM | POA: Insufficient documentation

## 2017-07-13 DIAGNOSIS — Z6830 Body mass index (BMI) 30.0-30.9, adult: Secondary | ICD-10-CM | POA: Diagnosis not present

## 2017-07-13 DIAGNOSIS — I208 Other forms of angina pectoris: Secondary | ICD-10-CM

## 2017-07-13 DIAGNOSIS — E669 Obesity, unspecified: Secondary | ICD-10-CM | POA: Diagnosis not present

## 2017-07-13 DIAGNOSIS — Z955 Presence of coronary angioplasty implant and graft: Secondary | ICD-10-CM | POA: Diagnosis not present

## 2017-07-13 DIAGNOSIS — E785 Hyperlipidemia, unspecified: Secondary | ICD-10-CM | POA: Diagnosis not present

## 2017-07-13 DIAGNOSIS — Z7902 Long term (current) use of antithrombotics/antiplatelets: Secondary | ICD-10-CM | POA: Diagnosis not present

## 2017-07-13 DIAGNOSIS — Z87891 Personal history of nicotine dependence: Secondary | ICD-10-CM | POA: Insufficient documentation

## 2017-07-13 DIAGNOSIS — I10 Essential (primary) hypertension: Secondary | ICD-10-CM | POA: Diagnosis present

## 2017-07-13 DIAGNOSIS — R000 Tachycardia, unspecified: Secondary | ICD-10-CM | POA: Diagnosis not present

## 2017-07-13 DIAGNOSIS — I252 Old myocardial infarction: Secondary | ICD-10-CM | POA: Diagnosis not present

## 2017-07-13 DIAGNOSIS — R61 Generalized hyperhidrosis: Secondary | ICD-10-CM

## 2017-07-13 DIAGNOSIS — J42 Unspecified chronic bronchitis: Secondary | ICD-10-CM | POA: Insufficient documentation

## 2017-07-13 DIAGNOSIS — Z9861 Coronary angioplasty status: Secondary | ICD-10-CM

## 2017-07-13 HISTORY — DX: Other forms of angina pectoris: I20.8

## 2017-07-13 LAB — CBC
HEMATOCRIT: 44.8 % (ref 39.0–52.0)
HEMOGLOBIN: 15.1 g/dL (ref 13.0–17.0)
MCH: 30 pg (ref 26.0–34.0)
MCHC: 33.7 g/dL (ref 30.0–36.0)
MCV: 89.1 fL (ref 78.0–100.0)
Platelets: 209 10*3/uL (ref 150–400)
RBC: 5.03 MIL/uL (ref 4.22–5.81)
RDW: 13.2 % (ref 11.5–15.5)
WBC: 11.2 10*3/uL — AB (ref 4.0–10.5)

## 2017-07-13 LAB — BASIC METABOLIC PANEL
ANION GAP: 11 (ref 5–15)
BUN: 11 mg/dL (ref 6–20)
CHLORIDE: 102 mmol/L (ref 101–111)
CO2: 25 mmol/L (ref 22–32)
Calcium: 8.9 mg/dL (ref 8.9–10.3)
Creatinine, Ser: 1 mg/dL (ref 0.61–1.24)
Glucose, Bld: 114 mg/dL — ABNORMAL HIGH (ref 65–99)
POTASSIUM: 3.7 mmol/L (ref 3.5–5.1)
SODIUM: 138 mmol/L (ref 135–145)

## 2017-07-13 LAB — I-STAT TROPONIN, ED: Troponin i, poc: 0 ng/mL (ref 0.00–0.08)

## 2017-07-13 NOTE — ED Provider Notes (Signed)
[facility] EMERGENCY DEPARTMENT Provider Note   CSN: [id number] Arrival date & time: 07/13/17  1813     History   Chief Complaint Chief Complaint  Patient presents with  . Hypertension    HPI Jason Mathis is a 62 y.o. male.  The history is provided by the patient.  Hypertension   He has history of hypertension, hyperlipidemia, coronary artery disease status post STEMI and stent placement and comes in because of an episode of sweating.  He was working in his yard Civil engineer, contracting and noted that he was getting very hot and he came inside and try to cool down but he continued to sweat for an extended period of time.  He also had an episode 3 days ago where he had unexplained sweating.  He checked his blood pressure at home and it was 140/100, which is very high for him.  Also, his heart rate was significantly higher than it normally is.  He denied chest pain, heaviness, tightness, pressure.  He denied nausea or vomiting.  He was concerned about the sweating so he went to urgent care where his EKG was noted to be abnormal and he was sent here.  He states that at triage, the EKG that was done at urgent care was compared with ones on record and was not significantly changed.  He has noted some decreasing exercise tolerance and increased fatigue recently.  Past Medical History:  Diagnosis Date  . CAD (coronary artery disease), native coronary artery 07/2011   Additional ~70-80% LAD lesion; Preserved LVEF  . CAD S/P percutaneous coronary angioplasty 09/07/2011   PCI of the LAD - Promus Element DES 4.0x71mm postdilated to 4.56mm  . Chronic bronchitis (HCC)    "used to get it every year til a few years ago" (09/07/11)  . Dyslipidemia, goal LDL below 70 07/15/2011  . H/O echocardiogram 07/15/2011   Normal EF 55-60%; no WMA  . History of nuclear stress test 08/24/2011   bruce protocol; mild ischemia in apical lateral regions; mild perfusion defect due to infarct/scar with  mod-severe perinfarct ischemia in basal inferoseptal/basal inferior/mid inferoseptal/mid inferior regions; high risk    . History of pneumonia 2003  . History of ST elevation myocardial infarction (STEMI) of inferior wall 07/15/2011   inferior; PCI of Proximal RCA & LAD  . Hypertension   . Kidney stones   . Metabolic syndrome    Hgb AiC 6.1 -- low HDL, elevated blood glucose, hypertension  . Obesity (BMI 30-39.9)   . Syncope and collapse April 2014   Post-micturition with orthostatic tendencies.    Patient Active Problem List   Diagnosis Date Noted  . Encounter for long-term (current) use of other medications 12/03/2012  . ST elevation myocardial infarction (STEMI) of inferior wall -  PCI to RCA with staged PCI to LAD lesion 11/28/2012  . Essential hypertension 11/28/2012  . Syncope and collapse 06/06/2012  . CAD S/P -PCI: PCI RCA during STEMI, staged PCI to LAD residual lesion (Abn Nuc ST) 07/16/2011    Class: Diagnosis of  . Family history of early CAD 07/16/2011  . Obesity (BMI 30-39.9) 07/16/2011  . Metabolic syndrome, obese, elevated glucose, Hgb A1c 6.1 07/16/2011  . Hyperlipidemia with target LDL less than 70 07/15/2011    Past Surgical History:  Procedure Laterality Date  . ANAL SPHINCTEROTOMY  1996  . KNEE ARTHROSCOPY  1999; 9/20099   r/t left knee meniscal tear  . LEFT HEART CATHETERIZATION WITH CORONARY ANGIOGRAM N/A 07/14/2011  Procedure: LEFT HEART CATHETERIZATION WITH CORONARY ANGIOGRAM;  Surgeon: [name], MD;  Location: [facility] CATH LAB;; Inf STEMI: 100% pRCA (PCI) -> 30% mid - RPDA & RPL1, 80% pLDA-60% (@ D1 that is med caliber bifurcating vessel ~20%), patent large RI & non-dom Cx-Lat OM.  Marland Kitchen LEFT HEART CATHETERIZATION WITH CORONARY ANGIOGRAM N/A 09/07/2011   Procedure: LEFT HEART CATHETERIZATION WITH CORONARY ANGIOGRAM;  Surgeon: [name], MD;  Location: [facility] CATH LAB;; widely patent RCA stent with 30% mid disease.  Persistent LAD roughly 80%, 60% lesions  surrounding trifurcation into the large D1 and S/P 1 ([place] 1,0,1-with minimal involvement of the diagonal branch.)  FFR highly positive.  Widely patent RI  & Cx-LatOM  . LITHOTRIPSY  1999  . PERCUTANEOUS CORONARY STENT INTERVENTION (PCI-S)  07/14/2011   Procedure: PERCUTANEOUS CORONARY STENT INTERVENTION (PCI-S);  Surgeon: [name], MD;  Location: [facility] CATH LAB;;; INF STEMI --> PCI of 100% RCA with Promus Element 3.0 mm x 16 mm DES; final diameter 3.25 mm  . PERCUTANEOUS CORONARY STENT INTERVENTION (PCI-S) Right 09/07/2011   Procedure: PERCUTANEOUS CORONARY STENT INTERVENTION (PCI-S);  Surgeon: [name], MD;  Location: [facility] CATH LAB;; Staged PCI of  p-mLAD with Promus Element DES 4.0x57mm postdilated to 4.63mm (Abn Myoview & highly + FFR)  . TONSILLECTOMY  1968  . TRANSTHORACIC ECHOCARDIOGRAM  07/15/2011   EF 55-60%        Home Medications    Prior to Admission medications   Medication Sig Start Date End Date Taking? Authorizing Provider  clopidogrel (PLAVIX) 75 MG tablet TAKE 1 TABLET (75 MG TOTAL) BY MOUTH DAILY. 06/27/17   [name], MD  cyproheptadine (PERIACTIN) 4 MG tablet Take 4 mg by mouth daily. 04/29/16   [provider]  FLUoxetine (PROZAC) 40 MG capsule 40 mg once. 04/07/15   [provider]  metoprolol succinate (TOPROL-XL) 25 MG 24 hr tablet TAKE 1 TABLET (25 MG TOTAL) BY MOUTH DAILY. 06/27/17   [name], MD  montelukast (SINGULAIR) 10 MG tablet Take 10 mg by mouth at bedtime.    [provider]  nitroGLYCERIN (NITROSTAT) 0.4 MG SL tablet Place 1 tablet (0.4 mg total) under the tongue every 5 (five) minutes as needed for chest pain. 04/26/16   [name], MD  Pitavastatin Calcium 4 MG TABS Take 1 tablet (4 mg total) by mouth every evening. 06/07/17   [name], MD    Family History Family History  Problem Relation Age of Onset  . Coronary artery disease Father 23       MI at 51y/o - died  . Hyperlipidemia Mother   .  Heart Problems Maternal Grandmother   . Pneumonia Maternal Grandfather     Social History Social History   Tobacco Use  . Smoking status: Never Smoker  . Smokeless tobacco: Former Neurosurgeon    Types: Chew  Substance Use Topics  . Alcohol use: Yes    Alcohol/week: 4.2 oz    Types: 7 Glasses of wine per week    Comment: 11/28/2012-occasional glass of wine, 09/07/11 "nothing since MI 07/14/11"  . Drug use: No     Allergies   Atorvastatin; Crestor [rosuvastatin]; Pravastatin; and Zetia [ezetimibe]   Review of Systems Review of Systems  All other systems reviewed and are negative.    Physical Exam Updated Vital Signs BP (!) 138/109 (BP Location: Right Arm)   Pulse 86   Temp 97.7 F (36.5 C) (Oral)   Resp 16  Ht 6\' 2"  (1.88 m)   Wt 106.6 kg (235 lb)   SpO2 97%   BMI 30.17 kg/m   Physical Exam  Nursing note and vitals reviewed.  62 year old male, resting comfortably and in no acute distress. Vital signs are significant for elevated diastolic blood pressure. Oxygen saturation is 97%, which is normal. Head is normocephalic and atraumatic. PERRLA, EOMI. Oropharynx is clear. Neck is nontender and supple without adenopathy or JVD. Back is nontender and there is no CVA tenderness. Lungs are clear without rales, wheezes, or rhonchi. Chest is nontender. Heart has regular rate and rhythm without murmur. Abdomen is soft, flat, nontender without masses or hepatosplenomegaly and peristalsis is normoactive. Extremities have no cyanosis or edema, full range of motion is present. Skin is warm and dry without rash. Neurologic: Mental status is normal, cranial nerves are intact, there are no motor or sensory deficits.  ED Treatments / Results  Labs (all labs ordered are listed, but only abnormal results are displayed) Labs Reviewed  BASIC METABOLIC PANEL - Abnormal; Notable for the following components:      Result Value   Glucose, Bld 114 (*)    All other components within normal  limits  CBC - Abnormal; Notable for the following components:   WBC 11.2 (*)    All other components within normal limits  I-STAT TROPONIN, ED  I-STAT TROPONIN, ED    EKG EKG Interpretation  Date/Time:  Wednesday Jul 13 2017 18:19:50 EDT Ventricular Rate:  107 PR Interval:  148 QRS Duration: 106 QT Interval:  348 QTC Calculation: 464 R Axis:   -23 Text Interpretation:  Sinus tachycardia Inferior infarct , age undetermined Cannot rule out Anterior infarct , age undetermined ST & T wave abnormality, consider lateral ischemia Abnormal ECG When compared with ECG of 01/21/2013, ST depression in Anterolateral leads is now present Confirmed by Dione Booze ([id number]) on 07/13/2017 11:04:03 PM   Radiology Dg Chest 2 View  Result Date: 07/13/2017 CLINICAL DATA:  Tachycardia and diaphoresis EXAM: CHEST - 2 VIEW COMPARISON:  01/21/2013 FINDINGS: The heart size and mediastinal contours are within normal limits. Both lungs are clear. The visualized skeletal structures are unremarkable. IMPRESSION: No active cardiopulmonary disease. Electronically Signed   By: Deatra Robinson M.D.   On: 07/13/2017 19:47    Procedures Procedures   Medications Ordered in ED Medications  aspirin chewable tablet 324 mg (324 mg Oral Given 07/14/17 0019)     Initial Impression / Assessment and Plan / ED Course  I have reviewed the triage vital signs and the nursing notes.  Pertinent labs & imaging results that were available during my care of the patient were reviewed by me and considered in my medical decision making (see chart for details).  Episodes of sweating which might be an angina equivalent-especially with his recent increase in fatigue.  I have reviewed the ECG done at urgent care, and it is unchanged from an ECG on record here last month.  However, ECG done here actually shows ST depression which had not been present on his ECG from urgent care.  He has been symptom-free since arriving here.  Initial troponin  is normal.  Will give aspirin and check delta troponin.  He clearly will need stress test to be done in the near future.  Old records are reviewed confirming that he is followed by cardiology following STEMI and stent placement in 2013.  Repeat troponin has risen minimally.  He continues to be symptom-free in the ED.  Case is discussed with Dr. Drinda Butts of cardiology service who has come to evaluate the patient and decided to admit him for further work-up.  Final Clinical Impressions(s) / ED Diagnoses   Final diagnoses:  Abnormal ECG  Diaphoresis    ED Discharge Orders    None       Dione Booze, MD 07/14/17 [phone]

## 2017-07-13 NOTE — ED Notes (Signed)
Please note: urine specimen collected and held at triage (no order yet).

## 2017-07-13 NOTE — ED Triage Notes (Signed)
Pt states that he went in to see PCP r/t his BP being high and having a fast HR. PCP told pt he had abnormal EKG.

## 2017-07-14 ENCOUNTER — Encounter (HOSPITAL_COMMUNITY): Payer: Self-pay | Admitting: *Deleted

## 2017-07-14 ENCOUNTER — Ambulatory Visit (HOSPITAL_COMMUNITY)
Admission: EM | Disposition: A | Discharge status: Home / Self Care | Payer: Self-pay | Source: Home / Self Care | Attending: Emergency Medicine

## 2017-07-14 ENCOUNTER — Observation Stay (HOSPITAL_BASED_OUTPATIENT_CLINIC_OR_DEPARTMENT_OTHER): Payer: BLUE CROSS/BLUE SHIELD

## 2017-07-14 DIAGNOSIS — I361 Nonrheumatic tricuspid (valve) insufficiency: Secondary | ICD-10-CM | POA: Diagnosis not present

## 2017-07-14 DIAGNOSIS — I200 Unstable angina: Secondary | ICD-10-CM

## 2017-07-14 DIAGNOSIS — I208 Other forms of angina pectoris: Secondary | ICD-10-CM

## 2017-07-14 DIAGNOSIS — I209 Angina pectoris, unspecified: Secondary | ICD-10-CM | POA: Diagnosis not present

## 2017-07-14 DIAGNOSIS — R9431 Abnormal electrocardiogram [ECG] [EKG]: Secondary | ICD-10-CM

## 2017-07-14 DIAGNOSIS — I2089 Other forms of angina pectoris: Secondary | ICD-10-CM

## 2017-07-14 HISTORY — PX: LEFT HEART CATH AND CORONARY ANGIOGRAPHY: CATH118249

## 2017-07-14 HISTORY — PX: TRANSTHORACIC ECHOCARDIOGRAM: SHX275

## 2017-07-14 LAB — CBC
HCT: 41.2 % (ref 39.0–52.0)
Hemoglobin: 13.8 g/dL (ref 13.0–17.0)
MCH: 29.7 pg (ref 26.0–34.0)
MCHC: 33.5 g/dL (ref 30.0–36.0)
MCV: 88.6 fL (ref 78.0–100.0)
Platelets: 186 10*3/uL (ref 150–400)
RBC: 4.65 MIL/uL (ref 4.22–5.81)
RDW: 13.2 % (ref 11.5–15.5)
WBC: 6.7 10*3/uL (ref 4.0–10.5)

## 2017-07-14 LAB — LIPID PANEL
Cholesterol: 146 mg/dL (ref 0–200)
HDL: 34 mg/dL — ABNORMAL LOW (ref >40)
LDL Cholesterol: 85 mg/dL (ref 0–99)
Total CHOL/HDL Ratio: 4.3 RATIO
Triglycerides: 133 mg/dL (ref <150)
VLDL: 27 mg/dL (ref 0–40)

## 2017-07-14 LAB — HEMOGLOBIN A1C
Hgb A1c MFr Bld: 5.5 % (ref 4.8–5.6)
Mean Plasma Glucose: 111.15 mg/dL

## 2017-07-14 LAB — TROPONIN I: Troponin I: <0.03 ng/mL (ref <0.03)

## 2017-07-14 LAB — I-STAT TROPONIN, ED: TROPONIN I, POC: 0.01 ng/mL (ref 0.00–0.08)

## 2017-07-14 LAB — HIV ANTIBODY (ROUTINE TESTING W REFLEX): HIV Screen 4th Generation wRfx: NONREACTIVE

## 2017-07-14 LAB — BASIC METABOLIC PANEL
Anion gap: 8 (ref 5–15)
BUN: 10 mg/dL (ref 6–20)
CO2: 26 mmol/L (ref 22–32)
Calcium: 8.8 mg/dL — ABNORMAL LOW (ref 8.9–10.3)
Chloride: 105 mmol/L (ref 101–111)
Creatinine, Ser: 0.78 mg/dL (ref 0.61–1.24)
GFR calc Af Amer: >60 mL/min (ref >60)
GFR calc non Af Amer: >60 mL/min (ref >60)
Glucose, Bld: 103 mg/dL — ABNORMAL HIGH (ref 65–99)
Potassium: 3.9 mmol/L (ref 3.5–5.1)
Sodium: 139 mmol/L (ref 135–145)

## 2017-07-14 SURGERY — LEFT HEART CATH AND CORONARY ANGIOGRAPHY
Anesthesia: LOCAL

## 2017-07-14 MED ORDER — MIDAZOLAM HCL 2 MG/2ML IJ SOLN
INTRAMUSCULAR | Status: DC | PRN
  Administered 2017-07-14: 1 mg via INTRAVENOUS

## 2017-07-14 MED ORDER — ACETAMINOPHEN 325 MG PO TABS: 650.0000 mg | ORAL_TABLET | ORAL | Status: DC | PRN

## 2017-07-14 MED ORDER — HEPARIN (PORCINE) IN NACL 1000-0.9 UT/500ML-% IV SOLN
INTRAVENOUS | Status: AC
  Filled 2017-07-14: qty 1000

## 2017-07-14 MED ORDER — LIDOCAINE HCL (PF) 1 % IJ SOLN
INTRAMUSCULAR | Status: DC | PRN
  Administered 2017-07-14: 2 mL

## 2017-07-14 MED ORDER — ASPIRIN 81 MG PO CHEW: 81.0000 mg | CHEWABLE_TABLET | ORAL | Status: DC

## 2017-07-14 MED ORDER — VERAPAMIL HCL 2.5 MG/ML IV SOLN
INTRAVENOUS | Status: DC | PRN
  Administered 2017-07-14: 10 mL via INTRA_ARTERIAL

## 2017-07-14 MED ORDER — FLUOXETINE HCL 40 MG PO CAPS: 40.0000 mg | ORAL_CAPSULE | Freq: Every day | ORAL | 3 refills | Status: DC

## 2017-07-14 MED ORDER — METOPROLOL SUCCINATE ER 25 MG PO TB24
25.0000 mg | ORAL_TABLET | Freq: Every day | ORAL | Status: DC
  Administered 2017-07-14: 25 mg via ORAL
  Filled 2017-07-14: qty 1

## 2017-07-14 MED ORDER — SODIUM CHLORIDE 0.9 % WEIGHT BASED INFUSION: 3.0000 mL/kg/h | INTRAVENOUS | Status: DC

## 2017-07-14 MED ORDER — HEPARIN SODIUM (PORCINE) 1000 UNIT/ML IJ SOLN
INTRAMUSCULAR | Status: AC
  Filled 2017-07-14: qty 1

## 2017-07-14 MED ORDER — ASPIRIN 81 MG PO TBEC: 81.0000 mg | DELAYED_RELEASE_TABLET | Freq: Every day | ORAL | Status: AC

## 2017-07-14 MED ORDER — SODIUM CHLORIDE 0.9 % WEIGHT BASED INFUSION: 1.0000 mL/kg/h | INTRAVENOUS | Status: DC

## 2017-07-14 MED ORDER — VERAPAMIL HCL 2.5 MG/ML IV SOLN
INTRAVENOUS | Status: AC
  Filled 2017-07-14: qty 2

## 2017-07-14 MED ORDER — ASPIRIN EC 81 MG PO TBEC: 81.0000 mg | DELAYED_RELEASE_TABLET | Freq: Every day | ORAL | Status: DC

## 2017-07-14 MED ORDER — FENTANYL CITRATE (PF) 100 MCG/2ML IJ SOLN
INTRAMUSCULAR | Status: DC | PRN
  Administered 2017-07-14: 25 ug via INTRAVENOUS

## 2017-07-14 MED ORDER — HEPARIN (PORCINE) IN NACL 2-0.9 UNITS/ML
INTRAMUSCULAR | Status: AC | PRN
  Administered 2017-07-14 (×2): 500 mL

## 2017-07-14 MED ORDER — ONDANSETRON HCL 4 MG/2ML IJ SOLN: 4.0000 mg | Freq: Four times a day (QID) | INTRAMUSCULAR | Status: DC | PRN

## 2017-07-14 MED ORDER — ISOSORBIDE MONONITRATE ER 30 MG PO TB24
30.0000 mg | ORAL_TABLET | Freq: Every day | ORAL | Status: DC
Start: 2017-07-14 — End: 2017-07-14

## 2017-07-14 MED ORDER — NITROGLYCERIN 0.4 MG SL SUBL: 0.4000 mg | SUBLINGUAL_TABLET | SUBLINGUAL | Status: DC | PRN

## 2017-07-14 MED ORDER — MIDAZOLAM HCL 2 MG/2ML IJ SOLN
INTRAMUSCULAR | Status: AC
  Filled 2017-07-14: qty 2

## 2017-07-14 MED ORDER — CLOPIDOGREL BISULFATE 75 MG PO TABS
75.0000 mg | ORAL_TABLET | Freq: Every day | ORAL | Status: DC
  Administered 2017-07-14: 75 mg via ORAL
  Filled 2017-07-14: qty 1

## 2017-07-14 MED ORDER — ASPIRIN 300 MG RE SUPP: 300.0000 mg | RECTAL | Status: AC

## 2017-07-14 MED ORDER — HEPARIN SODIUM (PORCINE) 1000 UNIT/ML IJ SOLN
INTRAMUSCULAR | Status: DC | PRN
  Administered 2017-07-14: 5000 [IU] via INTRAVENOUS

## 2017-07-14 MED ORDER — SODIUM CHLORIDE 0.9 % WEIGHT BASED INFUSION: 3.0000 mL/kg/h | INTRAVENOUS | Status: AC

## 2017-07-14 MED ORDER — ASPIRIN 81 MG PO CHEW
324.0000 mg | CHEWABLE_TABLET | Freq: Once | ORAL | Status: AC
  Administered 2017-07-14: 324 mg via ORAL
  Filled 2017-07-14: qty 4

## 2017-07-14 MED ORDER — FENTANYL CITRATE (PF) 100 MCG/2ML IJ SOLN
INTRAMUSCULAR | Status: AC
  Filled 2017-07-14: qty 2

## 2017-07-14 MED ORDER — LIDOCAINE HCL (PF) 1 % IJ SOLN
INTRAMUSCULAR | Status: AC
  Filled 2017-07-14: qty 30

## 2017-07-14 MED ORDER — IOPAMIDOL (ISOVUE-370) INJECTION 76%
INTRAVENOUS | Status: AC
  Filled 2017-07-14: qty 100

## 2017-07-14 MED ORDER — HEPARIN SODIUM (PORCINE) 5000 UNIT/ML IJ SOLN
5000.0000 [IU] | Freq: Three times a day (TID) | INTRAMUSCULAR | Status: DC
  Administered 2017-07-14: 5000 [IU] via SUBCUTANEOUS
  Filled 2017-07-14 (×2): qty 1

## 2017-07-14 MED ORDER — IOPAMIDOL (ISOVUE-370) INJECTION 76%
INTRAVENOUS | Status: DC | PRN
  Administered 2017-07-14: 60 mL

## 2017-07-14 MED ORDER — FLUOXETINE HCL 20 MG PO CAPS
40.0000 mg | ORAL_CAPSULE | Freq: Every day | ORAL | Status: DC
  Filled 2017-07-14: qty 2

## 2017-07-14 MED ORDER — ASPIRIN 81 MG PO CHEW: 324.0000 mg | CHEWABLE_TABLET | ORAL | Status: AC

## 2017-07-14 MED ORDER — MONTELUKAST SODIUM 10 MG PO TABS
10.0000 mg | ORAL_TABLET | Freq: Every day | ORAL | Status: DC
  Filled 2017-07-14: qty 1

## 2017-07-14 SURGICAL SUPPLY — 12 items
CATH INFINITI 5FR ANG PIGTAIL (CATHETERS) ×1 IMPLANT
CATH OPTITORQUE TIG 4.0 5F (CATHETERS) ×1 IMPLANT
DEVICE RAD COMP TR BAND LRG (VASCULAR PRODUCTS) ×1 IMPLANT
GLIDESHEATH SLEND A-KIT 6F 22G (SHEATH) ×1 IMPLANT
GUIDEWIRE INQWIRE 1.5J.035X260 (WIRE) IMPLANT
INQWIRE 1.5J .035X260CM (WIRE) ×2
KIT HEART LEFT (KITS) ×2 IMPLANT
PACK CARDIAC CATHETERIZATION (CUSTOM PROCEDURE TRAY) ×2 IMPLANT
SYR MEDRAD MARK V 150ML (SYRINGE) ×2 IMPLANT
TRANSDUCER W/STOPCOCK (MISCELLANEOUS) ×2 IMPLANT
TUBING CIL FLEX 10 FLL-RA (TUBING) ×2 IMPLANT
WIRE HI TORQ VERSACORE-J 145CM (WIRE) ×1 IMPLANT

## 2017-07-14 NOTE — Progress Notes (Signed)
  Echocardiogram 2D Echocardiogram has been performed.  Jason Mathis 07/14/2017, 10:20 AM

## 2017-07-14 NOTE — Discharge Summary (Addendum)
Discharge Summary    Patient ID: Jason Mathis,  MRN: [id number], DOB/AGE: 1955/11/19 62 y.o.  Admit date: 07/13/2017 Discharge date: 07/15/2017  Primary Care Provider: [name] Primary Cardiologist: Bryan Lemma, MD  Discharge Diagnoses    Principal Problem:   Angina at rest [facility]), negative MI, stable cardiac cath Active Problems:   Hyperlipidemia with target LDL less than 70   CAD S/P -PCI: PCI RCA during STEMI, staged PCI to LAD residual lesion (Abn Nuc ST)   Essential hypertension   Abnormal ECG  Allergies Allergies  Allergen Reactions  . Atorvastatin     Memory issues  . Crestor [Rosuvastatin]     Memory issues   . Pravastatin Other (See Comments)    Memory issues  . Zetia [Ezetimibe]     Myalgias    Diagnostic Studies/Procedures    07/14/17 Cardiac cath   LEFT HEART CATH AND CORONARY ANGIOGRAPHY  Conclusion     Previously placed Ost RCA to Prox RCA stent (unknown type) is widely patent.  Previously placed Prox LAD stent (unknown type) is widely patent.  The left ventricular systolic function is normal.  LV end diastolic pressure is normal.  The left ventricular ejection fraction is 55-65% by visual estimate.   Jason Mathis is a 62 y.o. male    Jason Mathis stents are widely patent in his RCA and proximal LAD.  He has no other significant CAD.  His LV function is normal.  There were no culprit lesions identified.  He will be treated for noncardiac symptoms.  The sheath was removed and a TR band was placed on the right wrist to achieve patent hemostasis.  The patient left the lab in stable condition.  History of Present Illness     78 yoM with hx CAD s/p inf STEMI in 2013 and subsequent PCI to LAD, HL and HTN who presented to ER 07/13/17 with abnormal episodes of sweating and fatigue-several episodes,  but no chest pain.  He had been having decreasing energy and fatigue.    ECG on his arrival to [facility] showed new ST depression in V3-V5,  although was not having symptoms at that time. Troponin 0.00 -> 0.01.  There was concern that this was anginal equivalent and with EKG changes he was admitted and plan for echo stress was scheduled.  Hospital Course     Consultants: none  By the AM of 07/14/17 he had not pain or anymore diaphoresis.  His tropoinins were neg but EKG remained abnormal.  Dr. Elease Hashimoto decided to proceed with cardiac cath instead of stress echo which was completed on 07/14/17.  Cardiac cath as above, stable and not anginal equivalent.  Dr. Elease Hashimoto talked to him at length prior to discharge.  We had considered imdur but after discussion this was not prescribed.   On the evening of 07/14/17, the pt had a small right radial cath site re-bleed. He was held overnight for further observation with the intent to discharge on 07/15/17 if stable. His cath site is unremarkable this AM. He was instructed to take it easy with his wrist and I will include specific instructions for him to follow.   The pt was seen and examined by Dr. Elease Hashimoto who feels that he is stable and ready for discharge today, 07/15/17.   He will follow in in 10 days in Dr. Ranae Palms 's office.  _____________  Discharge Vitals Blood pressure 103/80, pulse 78, temperature 97.6 F (36.4 C), temperature source Oral, resp. rate  20, height 6\' 2"  (1.88 m), weight 232 lb 3.2 oz (105.3 kg), SpO2 91 %.  Filed Weights   07/13/17 1822 07/14/17 1251 07/15/17 0500  Weight: 235 lb (106.6 kg) 232 lb 1.6 oz (105.3 kg) 232 lb 3.2 oz (105.3 kg)   Labs & Radiologic Studies    CBC Recent Labs    07/13/17 1833 07/14/17 0512  WBC 11.2* 6.7  HGB 15.1 13.8  HCT 44.8 41.2  MCV 89.1 88.6  PLT 209 186   Basic Metabolic Panel Recent Labs    37/10/62 1833 07/14/17 0512  NA 138 139  K 3.7 3.9  CL 102 105  CO2 25 26  GLUCOSE 114* 103*  BUN 11 10  CREATININE 1.00 0.78  CALCIUM 8.9 8.8*   Cardiac Enzymes Recent Labs    07/14/17 0512  TROPONINI <0.03    Hemoglobin A1C Recent Labs    07/14/17 1049  HGBA1C 5.5   Fasting Lipid Panel Recent Labs    07/14/17 1049  CHOL 146  HDL 34*  LDLCALC 85  TRIG 694  CHOLHDL 4.3   TDg Chest 2 View  Result Date: 07/13/2017 CLINICAL DATA:  Tachycardia and diaphoresis EXAM: CHEST - 2 VIEW COMPARISON:  01/21/2013 FINDINGS: The heart size and mediastinal contours are within normal limits. Both lungs are clear. The visualized skeletal structures are unremarkable. IMPRESSION: No active cardiopulmonary disease. Electronically Signed   By: Deatra Robinson M.D.   On: 07/13/2017 19:47   Disposition   Pt is being discharged home today in good condition.  Follow-up Plans & Appointments   Call [facility] at [phone] if any bleeding, swelling or drainage at cath site.  May shower, no tub baths for 48 hours for groin sticks. No lifting over 5 pounds for 3 days.  No Driving for 3 days.    Heart Healthy Diet  Call if any questions or problems.   Follow-up Information    Jodelle Gross, NP Follow up on 07/28/2017.   Specialties:  Nurse Practitioner, Radiology, Cardiology Why:  Your follow up appointment will be on 07/28/17 at 0930am. Please arrived by 0915am.  Contact information: [address] [place] [address] [phone]          Discharge Instructions    Call MD for:  redness, tenderness, or signs of infection (pain, swelling, redness, odor or green/yellow discharge around incision site)   Complete by:  As directed    Call MD for:  severe uncontrolled pain   Complete by:  As directed    Diet - low sodium heart healthy   Complete by:  As directed    Discharge instructions   Complete by:  As directed    No driving for 3 days. No lifting over 5 lbs for 1 week. No sexual activity for 1 week. Keep procedure site clean & dry. If you notice increased pain, swelling, bleeding or pus, call/return!  You may shower, but no soaking baths/hot tubs/pools for 1  week.   Please be careful with your wrist. No strenuous activity for the next several days. Please call the office if you notice bleeding from the site.   Increase activity slowly   Complete by:  As directed      Discharge Medications   Allergies as of 07/15/2017      Reactions   Atorvastatin    Memory issues   Crestor [rosuvastatin]    Memory issues   Pravastatin Other (See Comments)   Memory issues  Zetia [ezetimibe]    Myalgias      Medication List    STOP taking these medications   oxymetazoline 0.05 % nasal spray Commonly known as:  AFRIN     TAKE these medications   aspirin 81 MG EC tablet Take 1 tablet (81 mg total) by mouth daily.   cetirizine 10 MG tablet Commonly known as:  ZYRTEC Take 10 mg by mouth daily.   clopidogrel 75 MG tablet Commonly known as:  PLAVIX TAKE 1 TABLET (75 MG TOTAL) BY MOUTH DAILY.   cyproheptadine 4 MG tablet Commonly known as:  PERIACTIN Take 4 mg by mouth daily.   FLUoxetine 40 MG capsule Commonly known as:  PROZAC Take 1 capsule (40 mg total) by mouth daily. What changed:    how to take this  when to take this   metoprolol succinate 25 MG 24 hr tablet Commonly known as:  TOPROL-XL TAKE 1 TABLET (25 MG TOTAL) BY MOUTH DAILY.   nitroGLYCERIN 0.4 MG SL tablet Commonly known as:  NITROSTAT Place 1 tablet (0.4 mg total) under the tongue every 5 (five) minutes as needed for chest pain.   Pitavastatin Calcium 4 MG Tabs Take 1 tablet (4 mg total) by mouth every evening.       Outstanding Labs/Studies   None   Duration of Discharge Encounter   Greater than 30 minutes including physician time.  Signed, Daron Offer, NP 07/15/2017, 9:00 AM   Attending Note:   The patient was seen and examined.  Agree with assessment and plan as noted above.  Changes made to the above note as needed.  Patient seen and independently examined with Georgie Chard, NP .   We discussed all aspects of the encounter. I  agree with the assessment and plan as stated above.  1.   CAD: Patient presentation was somewhat atypical.  He was extremely fatigued and was sweating profusely.  He had ST segment depression.  We sent to cath.  At cath he was found to have no culprit lesions.  His stent was widely patent.  He was feeling much better.  He will go home on his same medications.  He will follow-up with Dr. [name].   I have spent a total of 40 minutes with patient reviewing hospital  notes , telemetry, EKGs, labs and examining patient as well as establishing an assessment and plan that was discussed with the patient. > 50% of time was spent in direct patient care.    Vesta Mixer, Montez Hageman., MD, [facility] 07/15/2017, 9:25 AM [address]  [address] Office [phone] Pager [phone]

## 2017-07-14 NOTE — Progress Notes (Signed)
Pt bled after wrist band removed.  Will keep overnight and re-apply per protocol.  Pt aware.

## 2017-07-14 NOTE — H&P (View-Only) (Signed)
Progress Note  Patient Name: Jason Mathis Date of Encounter: 07/14/2017  Primary Cardiologist:  [name]   Subjective   62 year old gentleman with a history of coronary artery disease, hyperlipidemia.  He was admitted through the emergency room last night with episodes of chest discomfort.  Had lots of progressive fatigue and diaphoresis.  He has ST segment depression on his EKG.  His troponin levels have been negative.  Presenting EKG revealed ST segment depression in leads V3 and V4.  The ST segment depression has resolved by this morning.  Inpatient Medications    Scheduled Meds: . aspirin  324 mg Oral NOW   Or  . aspirin  300 mg Rectal NOW  . [START ON 07/15/2017] aspirin EC  81 mg Oral Daily  . clopidogrel  75 mg Oral Daily  . FLUoxetine  40 mg Oral Daily  . heparin  5,000 Units Subcutaneous Q8H  . metoprolol succinate  25 mg Oral Daily  . montelukast  10 mg Oral QHS   Continuous Infusions:  PRN Meds: acetaminophen, nitroGLYCERIN, ondansetron (ZOFRAN) IV   Vital Signs    Vitals:   07/14/17 0715 07/14/17 0800 07/14/17 0830 07/14/17 0900  BP: 107/83 (!) 125/99 (!) 128/96 (!) 129/91  Pulse: 67 82 76 76  Resp: 16 17 16 19   Temp:      TempSrc:      SpO2: 92% 96% 92% 95%  Weight:      Height:       No intake or output data in the 24 hours ending 07/14/17 0951 Filed Weights   07/13/17 1822  Weight: 235 lb (106.6 kg)    Telemetry    NSR  - Personally Reviewed  ECG     EKG on presentation showed an inferior wall myocardial infarction.  He had 1 to 2 mm of ST segment depression in leads V3 through V5.  EKG this morning showed inferior Q waves which are unchanged.  The ST segment depression in the anterior leads has completely resolved.- Personally Reviewed  Physical Exam   GEN: No acute distress.   Neck: No JVD Cardiac: RRR, no murmurs, rubs, or gallops.  Respiratory: Clear to auscultation bilaterally. GI: Soft, nontender, non-distended  MS: No edema; No  deformity. Neuro:  Nonfocal  Psych: Normal affect   Labs    Chemistry Recent Labs  Lab 07/13/17 1833 07/14/17 0512  NA 138 139  K 3.7 3.9  CL 102 105  CO2 25 26  GLUCOSE 114* 103*  BUN 11 10  CREATININE 1.00 0.78  CALCIUM 8.9 8.8*  GFRNONAA >60 >60  GFRAA >60 >60  ANIONGAP 11 8     Hematology Recent Labs  Lab 07/13/17 1833 07/14/17 0512  WBC 11.2* 6.7  RBC 5.03 4.65  HGB 15.1 13.8  HCT 44.8 41.2  MCV 89.1 88.6  MCH 30.0 29.7  MCHC 33.7 33.5  RDW 13.2 13.2  PLT 209 186    Cardiac Enzymes Recent Labs  Lab 07/14/17 0512  TROPONINI <0.03    Recent Labs  Lab 07/13/17 1839 07/14/17 0031  TROPIPOC 0.00 0.01     BNPNo results for input(s): BNP, PROBNP in the last 168 hours.   DDimer No results for input(s): DDIMER in the last 168 hours.   Radiology    Dg Chest 2 View  Result Date: 07/13/2017 CLINICAL DATA:  Tachycardia and diaphoresis EXAM: CHEST - 2 VIEW COMPARISON:  01/21/2013 FINDINGS: The heart size and mediastinal contours are within normal limits. Both lungs are clear.  The visualized skeletal structures are unremarkable. IMPRESSION: No active cardiopulmonary disease. Electronically Signed   By: Deatra Robinson M.D.   On: 07/13/2017 19:47    Cardiac Studies     Patient Profile     62 y.o. male with a known history of inferior wall microinfarction and also a PT CA/stenting of the LAD.  He now presents with profuse sweating and profound fatigue.  He had ST segment depression in the anterior leads on presentation which is now resolved.  Assessment & Plan    1.  Coronary artery disease: The patient presents with some unusual symptoms including profound fatigue and diaphoresis.  The symptoms are not the same as his presenting symptoms with his myocardial infarction.  It should be noted that he had an extremely tight LAD stenosis and did not have any symptoms with that. He had ST segment depression last night on presentation and this morning these ST  segment depressions are completely gone.  She was originally set up for stress echocardiogram.  He already has had the initial images of the stress echo.  I canceled the remaining part of the test because of these EKG changes.  He needs to go for heart catheterization.  We discussed the risks, benefits, options of heart catheter station.  He understands and agrees to proceed.  2.  Hyperlipidemia: Continue  Livalo   his lipids remain elevated.  His last LDL in March, 2018 was 120.  His total cholesterol is 193.  He has a history of coronary artery disease.  He is intolerant to Crestor, Lipitor, Zetia.  We will need to refer him to the lipid clinic for consideration of PCSK9 inhibitors.   Discussed the risks, benefits, options concerning heart catheterization.  He understands and agrees to proceed.  For questions or updates, please contact [facility] Please consult [email or web address] for contact info under Cardiology/STEMI.      Signed, Kristeen Miss, MD  07/14/2017, 9:51 AM

## 2017-07-14 NOTE — Progress Notes (Signed)
Progress Note  Patient Name: Jason Mathis Date of Encounter: 07/14/2017  Primary Cardiologist:  [name]   Subjective   62 year old gentleman with a history of coronary artery disease, hyperlipidemia.  He was admitted through the emergency room last night with episodes of chest discomfort.  Had lots of progressive fatigue and diaphoresis.  He has ST segment depression on his EKG.  His troponin levels have been negative.  Presenting EKG revealed ST segment depression in leads V3 and V4.  The ST segment depression has resolved by this morning.  Inpatient Medications    Scheduled Meds: . aspirin  324 mg Oral NOW   Or  . aspirin  300 mg Rectal NOW  . [START ON 07/15/2017] aspirin EC  81 mg Oral Daily  . clopidogrel  75 mg Oral Daily  . FLUoxetine  40 mg Oral Daily  . heparin  5,000 Units Subcutaneous Q8H  . metoprolol succinate  25 mg Oral Daily  . montelukast  10 mg Oral QHS   Continuous Infusions:  PRN Meds: acetaminophen, nitroGLYCERIN, ondansetron (ZOFRAN) IV   Vital Signs    Vitals:   07/14/17 0715 07/14/17 0800 07/14/17 0830 07/14/17 0900  BP: 107/83 (!) 125/99 (!) 128/96 (!) 129/91  Pulse: 67 82 76 76  Resp: 16 17 16 19   Temp:      TempSrc:      SpO2: 92% 96% 92% 95%  Weight:      Height:       No intake or output data in the 24 hours ending 07/14/17 0951 Filed Weights   07/13/17 1822  Weight: 235 lb (106.6 kg)    Telemetry    NSR  - Personally Reviewed  ECG     EKG on presentation showed an inferior wall myocardial infarction.  He had 1 to 2 mm of ST segment depression in leads V3 through V5.  EKG this morning showed inferior Q waves which are unchanged.  The ST segment depression in the anterior leads has completely resolved.- Personally Reviewed  Physical Exam   GEN: No acute distress.   Neck: No JVD Cardiac: RRR, no murmurs, rubs, or gallops.  Respiratory: Clear to auscultation bilaterally. GI: Soft, nontender, non-distended  MS: No edema; No  deformity. Neuro:  Nonfocal  Psych: Normal affect   Labs    Chemistry Recent Labs  Lab 07/13/17 1833 07/14/17 0512  NA 138 139  K 3.7 3.9  CL 102 105  CO2 25 26  GLUCOSE 114* 103*  BUN 11 10  CREATININE 1.00 0.78  CALCIUM 8.9 8.8*  GFRNONAA >60 >60  GFRAA >60 >60  ANIONGAP 11 8     Hematology Recent Labs  Lab 07/13/17 1833 07/14/17 0512  WBC 11.2* 6.7  RBC 5.03 4.65  HGB 15.1 13.8  HCT 44.8 41.2  MCV 89.1 88.6  MCH 30.0 29.7  MCHC 33.7 33.5  RDW 13.2 13.2  PLT 209 186    Cardiac Enzymes Recent Labs  Lab 07/14/17 0512  TROPONINI <0.03    Recent Labs  Lab 07/13/17 1839 07/14/17 0031  TROPIPOC 0.00 0.01     BNPNo results for input(s): BNP, PROBNP in the last 168 hours.   DDimer No results for input(s): DDIMER in the last 168 hours.   Radiology    Dg Chest 2 View  Result Date: 07/13/2017 CLINICAL DATA:  Tachycardia and diaphoresis EXAM: CHEST - 2 VIEW COMPARISON:  01/21/2013 FINDINGS: The heart size and mediastinal contours are within normal limits. Both lungs are clear.  The visualized skeletal structures are unremarkable. IMPRESSION: No active cardiopulmonary disease. Electronically Signed   By: Deatra Robinson M.D.   On: 07/13/2017 19:47    Cardiac Studies     Patient Profile     62 y.o. male with a known history of inferior wall microinfarction and also a PT CA/stenting of the LAD.  He now presents with profuse sweating and profound fatigue.  He had ST segment depression in the anterior leads on presentation which is now resolved.  Assessment & Plan    1.  Coronary artery disease: The patient presents with some unusual symptoms including profound fatigue and diaphoresis.  The symptoms are not the same as his presenting symptoms with his myocardial infarction.  It should be noted that he had an extremely tight LAD stenosis and did not have any symptoms with that. He had ST segment depression last night on presentation and this morning these ST  segment depressions are completely gone.  She was originally set up for stress echocardiogram.  He already has had the initial images of the stress echo.  I canceled the remaining part of the test because of these EKG changes.  He needs to go for heart catheterization.  We discussed the risks, benefits, options of heart catheter station.  He understands and agrees to proceed.  2.  Hyperlipidemia: Continue  Livalo   his lipids remain elevated.  His last LDL in March, 2018 was 120.  His total cholesterol is 193.  He has a history of coronary artery disease.  He is intolerant to Crestor, Lipitor, Zetia.  We will need to refer him to the lipid clinic for consideration of PCSK9 inhibitors.   Discussed the risks, benefits, options concerning heart catheterization.  He understands and agrees to proceed.  For questions or updates, please contact [facility] Please consult [email or web address] for contact info under Cardiology/STEMI.      Signed, Kristeen Miss, MD  07/14/2017, 9:51 AM

## 2017-07-14 NOTE — Discharge Instructions (Signed)
Call [facility] at [phone] if any bleeding, swelling or drainage at cath site.  May shower, no tub baths for 48 hours for groin sticks. No lifting over 5 pounds for 3 days.  No Driving for 3 days.    Heart Healthy Diet  Call if any questions or problems.

## 2017-07-14 NOTE — H&P (Signed)
History & Physical    Patient ID: Jason Mathis MRN: [id number], DOB/AGE: 1955-10-31   Admit date: 07/13/2017   Primary Physician: [name], MD Primary Cardiologist: No primary care provider on file.  Patient Profile    Mr. Horton is a 62 year old gentleman with CAD s/p inf STEMI in 2013 and subsequent PCI to the LAD, HL, HTN who presents due to abnormal episodes of sweating and fatigue.   Past Medical History    Past Medical History:  Diagnosis Date  . CAD (coronary artery disease), native coronary artery 07/2011   Additional ~70-80% LAD lesion; Preserved LVEF  . CAD S/P percutaneous coronary angioplasty 09/07/2011   PCI of the LAD - Promus Element DES 4.0x53mm postdilated to 4.43mm  . Chronic bronchitis (HCC)    "used to get it every year til a few years ago" (09/07/11)  . Dyslipidemia, goal LDL below 70 07/15/2011  . H/O echocardiogram 07/15/2011   Normal EF 55-60%; no WMA  . History of nuclear stress test 08/24/2011   bruce protocol; mild ischemia in apical lateral regions; mild perfusion defect due to infarct/scar with mod-severe perinfarct ischemia in basal inferoseptal/basal inferior/mid inferoseptal/mid inferior regions; high risk    . History of pneumonia 2003  . History of ST elevation myocardial infarction (STEMI) of inferior wall 07/15/2011   inferior; PCI of Proximal RCA & LAD  . Hypertension   . Kidney stones   . Metabolic syndrome    Hgb AiC 6.1 -- low HDL, elevated blood glucose, hypertension  . Obesity (BMI 30-39.9)   . Syncope and collapse April 2014   Post-micturition with orthostatic tendencies.    Past Surgical History:  Procedure Laterality Date  . ANAL SPHINCTEROTOMY  1996  . KNEE ARTHROSCOPY  1999; 9/20099   r/t left knee meniscal tear  . LEFT HEART CATHETERIZATION WITH CORONARY ANGIOGRAM N/A 07/14/2011   Procedure: LEFT HEART CATHETERIZATION WITH CORONARY ANGIOGRAM;  Surgeon: [name], MD;  Location: [facility] CATH LAB;; Inf STEMI: 100% pRCA (PCI) ->  30% mid - RPDA & RPL1, 80% pLDA-60% (@ D1 that is med caliber bifurcating vessel ~20%), patent large RI & non-dom Cx-Lat OM.  Marland Kitchen LEFT HEART CATHETERIZATION WITH CORONARY ANGIOGRAM N/A 09/07/2011   Procedure: LEFT HEART CATHETERIZATION WITH CORONARY ANGIOGRAM;  Surgeon: [name], MD;  Location: [facility] CATH LAB;; widely patent RCA stent with 30% mid disease.  Persistent LAD roughly 80%, 60% lesions surrounding trifurcation into the large D1 and S/P 1 ([place] 1,0,1-with minimal involvement of the diagonal branch.)  FFR highly positive.  Widely patent RI  & Cx-LatOM  . LITHOTRIPSY  1999  . PERCUTANEOUS CORONARY STENT INTERVENTION (PCI-S)  07/14/2011   Procedure: PERCUTANEOUS CORONARY STENT INTERVENTION (PCI-S);  Surgeon: [name], MD;  Location: [facility] CATH LAB;;; INF STEMI --> PCI of 100% RCA with Promus Element 3.0 mm x 16 mm DES; final diameter 3.25 mm  . PERCUTANEOUS CORONARY STENT INTERVENTION (PCI-S) Right 09/07/2011   Procedure: PERCUTANEOUS CORONARY STENT INTERVENTION (PCI-S);  Surgeon: [name], MD;  Location: [facility] CATH LAB;; Staged PCI of  p-mLAD with Promus Element DES 4.0x77mm postdilated to 4.8mm (Abn Myoview & highly + FFR)  . TONSILLECTOMY  1968  . TRANSTHORACIC ECHOCARDIOGRAM  07/15/2011   EF 55-60%     Allergies  Allergies  Allergen Reactions  . Atorvastatin     Memory issues  . Crestor [Rosuvastatin]     Memory issues   . Pravastatin Other (See Comments)    Memory issues  .  Zetia [Ezetimibe]     Myalgias     History of Present Illness    Mr. Port is a 62 year old gentleman with CAD s/p inf STEMI in 2013 and subsequent PCI to the LAD, HL, HTN who presents due to abnormal episodes of sweating and fatigue.  The patient reports that leading up to his presentation he has noted decreasing energy with increased fatigue. On 5/5, the patient reports that he developed sudden onset of profuse sweating in the evening. He noted that he hadn't eaten much that day and attributed it  to that but did require him to change his shirt. On the day of presentation, he was outside in the heat doing lawn work but not beyond normal daily exertion for him when he again developed profuse sweating and fatigue. He went inside to try to cool down, attributing the symptoms to the heat but was unable to stop sweating. Given multiple episodes, he presented to urgent care and was subsequently transferred to the [facility]. Notably the patient reports that he has never had significant chest pain with his prior presentations. Has noted decreased energy recently.  In the ED, the patient was hemodynamically stable but ECG on his arrival to [facility] showed new ST depression in V3-V5, although was not having symptoms at that time. Troponin 0.00 -> 0.01. On my evaluation, he is chest pain free.   Home Medications    Prior to Admission medications   Medication Sig Start Date End Date Taking? Authorizing Provider  cetirizine (ZYRTEC) 10 MG tablet Take 10 mg by mouth daily.   Yes [provider]  clopidogrel (PLAVIX) 75 MG tablet TAKE 1 TABLET (75 MG TOTAL) BY MOUTH DAILY. 06/27/17  Yes [name], MD  cyproheptadine (PERIACTIN) 4 MG tablet Take 4 mg by mouth daily. 04/29/16  Yes [provider]  metoprolol succinate (TOPROL-XL) 25 MG 24 hr tablet TAKE 1 TABLET (25 MG TOTAL) BY MOUTH DAILY. 06/27/17  Yes [name], MD  nitroGLYCERIN (NITROSTAT) 0.4 MG SL tablet Place 1 tablet (0.4 mg total) under the tongue every 5 (five) minutes as needed for chest pain. 04/26/16  Yes [name], MD  oxymetazoline (AFRIN) 0.05 % nasal spray Place 1 spray into both nostrils 2 (two) times daily as needed for congestion.   Yes [provider]  Pitavastatin Calcium 4 MG TABS Take 1 tablet (4 mg total) by mouth every evening. 06/07/17  Yes [name], MD    Family History    Family History  Problem Relation Age of Onset  . Coronary artery disease Father 36       MI at  51y/o - died  . Hyperlipidemia Mother   . Heart Problems Maternal Grandmother   . Pneumonia Maternal Grandfather    indicated that the status of his mother is unknown. He indicated that the status of his father is unknown. He indicated that the status of his maternal grandmother is unknown. He indicated that the status of his maternal grandfather is unknown.   Social History    Social History   Socioeconomic History  . Marital status: Married    Spouse name: Not on file  . Number of children: 2  . Years of education: PhD.   . Highest education level: Not on file  Occupational History    Employer: [employer]  Social Needs  . Financial resource strain: Not on file  . Food insecurity:    Worry: Not on file  Inability: Not on file  . Transportation needs:    Medical: Not on file    Non-medical: Not on file  Tobacco Use  . Smoking status: Never Smoker  . Smokeless tobacco: Former Neurosurgeon    Types: Chew  Substance and Sexual Activity  . Alcohol use: Yes    Alcohol/week: 4.2 oz    Types: 7 Glasses of wine per week    Comment: 11/28/2012-occasional glass of wine, 09/07/11 "nothing since MI 07/14/11"  . Drug use: No  . Sexual activity: Yes  Lifestyle  . Physical activity:    Days per week: Not on file    Minutes per session: Not on file  . Stress: Not on file  Relationships  . Social connections:    Talks on phone: Not on file    Gets together: Not on file    Attends religious service: Not on file    Active member of club or organization: Not on file    Attends meetings of clubs or organizations: Not on file    Relationship status: Not on file  . Intimate partner violence:    Fear of current or ex partner: Not on file    Emotionally abused: Not on file    Physically abused: Not on file    Forced sexual activity: Not on file  Other Topics Concern  . Not on file  Social History Narrative   Married, father of 2. He is a Scientist, research (physical sciences). He exercises routinely about 3 days  a week for about 60 minutes a day. Does not smoke does not drink. He has although the weight due to less frequent exercise and dietary modification.   He has a PhD in Animal nutritionist; but currently works as a Oceanographer.     Review of Systems    General:  No chills, fever, night sweats or weight changes.  Cardiovascular:  As per HPI Dermatological: No rash, lesions/masses Respiratory: No cough, dyspnea Urologic: No hematuria, dysuria Abdominal:   No nausea, vomiting, diarrhea, bright red blood per rectum, melena, or hematemesis Neurologic:  No visual changes, wkns, changes in mental status. All other systems reviewed and are otherwise negative except as noted above.  Physical Exam    Blood pressure (!) 143/104, pulse 89, temperature 97.7 F (36.5 C), temperature source Oral, resp. rate 12, height 6\' 2"  (1.88 m), weight 106.6 kg (235 lb), SpO2 94 %.  General: Pleasant, NAD; overweight gentleman Psych: Normal affect. Neuro: Alert and oriented X 3. Moves all extremities spontaneously. HEENT: Normal  Neck: Supple without bruits or JVD. Lungs:  Resp regular and unlabored, CTA. Heart: RRR no s3, s4, or murmurs. Abdomen: Soft, non-tender, non-distended, BS + x 4.  Extremities: No clubbing, cyanosis or edema. DP/PT/Radials 2+ and equal bilaterally.  Labs    Troponin [facility] of Care Test) Recent Labs    07/14/17 0031  TROPIPOC 0.01   No results for input(s): CKTOTAL, CKMB, TROPONINI in the last 72 hours. Lab Results  Component Value Date   WBC 11.2 (H) 07/13/2017   HGB 15.1 07/13/2017   HCT 44.8 07/13/2017   MCV 89.1 07/13/2017   PLT 209 07/13/2017    Recent Labs  Lab 07/13/17 1833  NA 138  K 3.7  CL 102  CO2 25  BUN 11  CREATININE 1.00  CALCIUM 8.9  GLUCOSE 114*   Lab Results  Component Value Date   CHOL 193 05/11/2016   HDL 39 (L) 05/11/2016   LDLCALC 120 (H) 05/11/2016  TRIG 169 (H) 05/11/2016   Lab Results  Component Value Date   DDIMER 0.28  01/21/2013     Radiology Studies    Dg Chest 2 View  Result Date: 07/13/2017 CLINICAL DATA:  Tachycardia and diaphoresis EXAM: CHEST - 2 VIEW COMPARISON:  01/21/2013 FINDINGS: The heart size and mediastinal contours are within normal limits. Both lungs are clear. The visualized skeletal structures are unremarkable. IMPRESSION: No active cardiopulmonary disease. Electronically Signed   By: Deatra Robinson M.D.   On: 07/13/2017 19:47    ECG & Cardiac Imaging    07/13/17: sinus tachycardia. 1 mm ST depression V3-V5, new from prior  TTE 07/2011: Study Conclusions  Left ventricle: The cavity size was normal. Wall thickness was normal. Systolic function was normal. The estimated ejection fraction was in the range of 55% to 60%. Wall motion was normal; there were no regional wall motion abnormalities. Left ventricular diastolic function parameters were normal.      Assessment & Plan    Mr. Yan is a 62 year old gentleman with CAD s/p inf STEMI in 2013 and subsequent PCI to the LAD, HL, HTN who presents due to abnormal episodes of sweating and fatigue. Symptoms are definitely atypical but concerned that these sweating episodes may be anginal equivalent given new ECG changes and history of atypical symptoms in the setting of severe disease.   # Anginal equivalent Concerning that sweating episodes represent anginal equivalent. ECG with ST segment depression new from prior. Troponin not elevated. - Cont to trend troponin - cont ASA, plavix, home metoprolol - Takes pitavastatin as outpatient, multiple allergies to statin medications; hold for now as pitavastatin not available as inpatient - plan for echo stress test in AM for further evaluation - A1C, lipid panel - SL NG if recurrent episodes  # HL - Holding statin as above - Lipid panel pending  # HTN Blood pressure mildly elevated on presentation - Cont home metoprolol  # Full code   Signed, Starlyn Skeans, MD 07/14/2017, 2:47  AM

## 2017-07-14 NOTE — Interval H&P Note (Signed)
Cath Lab Visit (complete for each Cath Lab visit)  Clinical Evaluation Leading to the Procedure:   ACS: Yes.    Non-ACS:    Anginal Classification: CCS II  Anti-ischemic medical therapy: Minimal Therapy (1 class of medications)  Non-Invasive Test Results: No non-invasive testing performed  Prior CABG: No previous CABG      History and Physical Interval Note:  07/14/2017 4:37 PM  Jason Mathis  has presented today for surgery, with the diagnosis of unstable angina  The various methods of treatment have been discussed with the patient and family. After consideration of risks, benefits and other options for treatment, the patient has consented to  Procedure(s): LEFT HEART CATH AND CORONARY ANGIOGRAPHY (N/A) as a surgical intervention .  The patient's history has been reviewed, patient examined, no change in status, stable for surgery.  I have reviewed the patient's chart and labs.  Questions were answered to the patient's satisfaction.     Nanetta Batty

## 2017-07-15 ENCOUNTER — Encounter (HOSPITAL_COMMUNITY): Payer: Self-pay | Admitting: Cardiovascular Disease

## 2017-07-15 ENCOUNTER — Telehealth: Payer: Self-pay | Admitting: Cardiology

## 2017-07-15 DIAGNOSIS — I10 Essential (primary) hypertension: Secondary | ICD-10-CM | POA: Diagnosis not present

## 2017-07-15 DIAGNOSIS — E785 Hyperlipidemia, unspecified: Secondary | ICD-10-CM | POA: Diagnosis not present

## 2017-07-15 DIAGNOSIS — Z9861 Coronary angioplasty status: Secondary | ICD-10-CM | POA: Diagnosis not present

## 2017-07-15 DIAGNOSIS — I2510 Atherosclerotic heart disease of native coronary artery without angina pectoris: Secondary | ICD-10-CM | POA: Diagnosis not present

## 2017-07-15 DIAGNOSIS — Z955 Presence of coronary angioplasty implant and graft: Secondary | ICD-10-CM | POA: Diagnosis not present

## 2017-07-15 DIAGNOSIS — I25118 Atherosclerotic heart disease of native coronary artery with other forms of angina pectoris: Secondary | ICD-10-CM | POA: Diagnosis not present

## 2017-07-15 DIAGNOSIS — I208 Other forms of angina pectoris: Secondary | ICD-10-CM | POA: Diagnosis not present

## 2017-07-15 NOTE — Progress Notes (Signed)
Pt IV discontinued, catheter intact and telemetry removed. Pt has all belongings. Pt discharge education completed at bedside. Pt discharged via wheelchair with NT

## 2017-07-15 NOTE — Telephone Encounter (Signed)
New Message  TOC appt made on 07/28/2017 at 9:30 with Joni Reining, per Noreene Larsson at [facility]

## 2017-07-18 NOTE — Telephone Encounter (Signed)
Left a message to call back.

## 2017-07-18 NOTE — Telephone Encounter (Signed)
Will forward this message to out [employer] as the pt will be following up with Joni Reining.

## 2017-07-19 MED FILL — Heparin Sod (Porcine)-NaCl IV Soln 1000 Unit/500ML-0.9%: INTRAVENOUS | Qty: 1000 | Status: AC

## 2017-07-19 NOTE — Telephone Encounter (Signed)
Patient contacted regarding discharge from [facility] on 07/15/17.  Patient understands to follow up with provider K. Lyman Bishop, DNP on 07/28/17 at 0930 at [facility]. Patient understands discharge instructions? YES Patient understands medications and regiment? YES Patient understands to bring all medications to this visit? YES

## 2017-07-27 NOTE — Progress Notes (Signed)
Cardiology Office Note   Date:  07/28/2017   ID:  Jason Mathis, DOB 07/17/1955, MRN [id number]  PCP:  Jason Rua, MD  Cardiologist:  Dr. [name] Chief Complaint  Patient presents with  . Transitions Of Care  . Coronary Artery Disease     History of Present Illness: Jason Mathis is a 62 y.o. male who presents for post hospital follow up after admission for chest pain with episodes of sweating and fatigue, with known history of CAD with stents to the RCA and to the proximal LAD. She had repeat cardiac cath on 07/14/2017 which revealed patent graphs. Other history includes hyperlipidemia on Livalo.   He comes today without any cardiac complaints, he remains active, medically compliant, and has had no side effects from any medications.  The patient is planning a trip to [place] in September.  Past Medical History:  Diagnosis Date  . Angina at rest [facility]), negative MI, stable cardiac cath 07/14/2017  . CAD (coronary artery disease), native coronary artery 07/2011   Additional ~70-80% LAD lesion; Preserved LVEF  . CAD S/P percutaneous coronary angioplasty 09/07/2011   PCI of the LAD - Promus Element DES 4.0x78mm postdilated to 4.80mm  . Chronic bronchitis (HCC)    "used to get it every year til a few years ago" (09/07/11)  . Dyslipidemia, goal LDL below 70 07/15/2011  . H/O echocardiogram 07/15/2011   Normal EF 55-60%; no WMA  . History of nuclear stress test 08/24/2011   bruce protocol; mild ischemia in apical lateral regions; mild perfusion defect due to infarct/scar with mod-severe perinfarct ischemia in basal inferoseptal/basal inferior/mid inferoseptal/mid inferior regions; high risk    . History of pneumonia 2003  . History of ST elevation myocardial infarction (STEMI) of inferior wall 07/15/2011   inferior; PCI of Proximal RCA & LAD  . Hypertension   . Kidney stones   . Metabolic syndrome    Hgb AiC 6.1 -- low HDL, elevated blood glucose, hypertension  . Obesity (BMI 30-39.9)   . Syncope  and collapse April 2014   Post-micturition with orthostatic tendencies.    Past Surgical History:  Procedure Laterality Date  . ANAL SPHINCTEROTOMY  1996  . KNEE ARTHROSCOPY  1999; 9/20099   r/t left knee meniscal tear  . LEFT HEART CATH AND CORONARY ANGIOGRAPHY N/A 07/14/2017   Procedure: LEFT HEART CATH AND CORONARY ANGIOGRAPHY;  Surgeon: [name], MD;  Location: [facility] INVASIVE CV LAB;  Service: Cardiovascular;  Laterality: N/A;  . LEFT HEART CATHETERIZATION WITH CORONARY ANGIOGRAM N/A 07/14/2011   Procedure: LEFT HEART CATHETERIZATION WITH CORONARY ANGIOGRAM;  Surgeon: [name], MD;  Location: [facility] CATH LAB;; Inf STEMI: 100% pRCA (PCI) -> 30% mid - RPDA & RPL1, 80% pLDA-60% (@ D1 that is med caliber bifurcating vessel ~20%), patent large RI & non-dom Cx-Lat OM.  Marland Kitchen LEFT HEART CATHETERIZATION WITH CORONARY ANGIOGRAM N/A 09/07/2011   Procedure: LEFT HEART CATHETERIZATION WITH CORONARY ANGIOGRAM;  Surgeon: [name], MD;  Location: [facility] CATH LAB;; widely patent RCA stent with 30% mid disease.  Persistent LAD roughly 80%, 60% lesions surrounding trifurcation into the large D1 and S/P 1 ([place] 1,0,1-with minimal involvement of the diagonal branch.)  FFR highly positive.  Widely patent RI  & Cx-LatOM  . LITHOTRIPSY  1999  . PERCUTANEOUS CORONARY STENT INTERVENTION (PCI-S)  07/14/2011   Procedure: PERCUTANEOUS CORONARY STENT INTERVENTION (PCI-S);  Surgeon: [name], MD;  Location: [facility] CATH LAB;;; INF STEMI --> PCI of 100% RCA with Promus Element 3.0  mm x 16 mm DES; final diameter 3.25 mm  . PERCUTANEOUS CORONARY STENT INTERVENTION (PCI-S) Right 09/07/2011   Procedure: PERCUTANEOUS CORONARY STENT INTERVENTION (PCI-S);  Surgeon: [name], MD;  Location: [facility] CATH LAB;; Staged PCI of  p-mLAD with Promus Element DES 4.0x58mm postdilated to 4.32mm (Abn Myoview & highly + FFR)  . TONSILLECTOMY  1968  . TRANSTHORACIC ECHOCARDIOGRAM  07/15/2011   EF 55-60%     Current Outpatient  Medications  Medication Sig Dispense Refill  . aspirin EC 81 MG EC tablet Take 1 tablet (81 mg total) by mouth daily.    . cetirizine (ZYRTEC) 10 MG tablet Take 10 mg by mouth daily.    . clopidogrel (PLAVIX) 75 MG tablet Take 1 tablet (75 mg total) by mouth daily. 90 tablet 3  . cyproheptadine (PERIACTIN) 4 MG tablet Take 4 mg by mouth daily.    . metoprolol succinate (TOPROL-XL) 25 MG 24 hr tablet Take 1 tablet (25 mg total) by mouth daily. 90 tablet 3  . nitroGLYCERIN (NITROSTAT) 0.4 MG SL tablet Place 1 tablet (0.4 mg total) under the tongue every 5 (five) minutes as needed for chest pain. 25 tablet 3  . Pitavastatin Calcium 4 MG TABS Take 1 tablet (4 mg total) by mouth every evening. 90 tablet 3   No current facility-administered medications for this visit.     Allergies:   Atorvastatin; Crestor [rosuvastatin]; Pravastatin; and Zetia [ezetimibe]    Social History:  The patient  reports that he has never smoked. He quit smokeless tobacco use about 14 years ago. His smokeless tobacco use included chew. He reports that he drinks about 4.2 oz of alcohol per week. He reports that he does not use drugs.   Family History:  The patient's family history includes Coronary artery disease (age of onset: 29) in his father; Heart Problems in his maternal grandmother; Hyperlipidemia in his mother; Pneumonia in his maternal grandfather.    ROS: All other systems are reviewed and negative. Unless otherwise mentioned in H&P    PHYSICAL EXAM: VS:  BP 115/82   Pulse 84   Ht 6\' 2"  (1.88 m)   Wt 241 lb 9.6 oz (109.6 kg)   BMI 31.02 kg/m  , BMI Body mass index is 31.02 kg/m. GEN: Well nourished, well developed, in no acute distress  HEENT: normal  Neck: no JVD, carotid bruits, or masses Cardiac: RRR; no murmurs, rubs, or gallops,no edema  Respiratory:  Clear to auscultation bilaterally, normal work of breathing GI: soft, nontender, nondistended, + BS MS: no deformity or atrophy  Skin: warm and  dry, no rash Neuro:  Strength and sensation are intact Psych: euthymic mood, full affect   EKG:  Not completed on this office visit.   Recent Labs: 07/14/2017: BUN 10; Creatinine, Ser 0.78; Hemoglobin 13.8; Platelets 186; Potassium 3.9; Sodium 139    Lipid Panel    Component Value Date/Time   CHOL 146 07/14/2017 1049   TRIG 133 07/14/2017 1049   HDL 34 (L) 07/14/2017 1049   CHOLHDL 4.3 07/14/2017 1049   VLDL 27 07/14/2017 1049   LDLCALC 85 07/14/2017 1049      Wt Readings from Last 3 Encounters:  07/28/17 241 lb 9.6 oz (109.6 kg)  07/15/17 232 lb 3.2 oz (105.3 kg)  06/07/17 247 lb (112 kg)      Other studies Reviewed: Cardia cath Cardiac cath   LEFT HEART CATH AND CORONARY ANGIOGRAPHY  Conclusion     Previously placed Ost RCA to  Prox RCA stent (unknown type) is widely patent.  Previously placed Prox LAD stent (unknown type) is widely patent.  The left ventricular systolic function is normal.  LV end diastolic pressure is normal.  The left ventricular ejection fraction is 55-65% by visual estimate.   Echocardiogram: 07/14/2017 Left ventricle: The cavity size was normal. Systolic function was   normal. The estimated ejection fraction was in the range of 55%   to 60%. Wall motion was normal; there were no regional wall   motion abnormalities. Doppler parameters are consistent with   abnormal left ventricular relaxation (grade 1 diastolic   dysfunction). - Aortic root: The aortic root was normal in size. - Mitral valve: There was mild regurgitation. - Left atrium: The atrium was mildly dilated. - Right ventricle: Systolic function was normal. - Right atrium: The atrium was normal in size. - Tricuspid valve: There was mild regurgitation. - Pulmonic valve: There was mild regurgitation. - Pulmonary arteries: Systolic pressure was within the normal   range. - Inferior vena cava: The vessel was normal in size. - Pericardium, extracardiac: There was no  pericardial effusion.  ASSESSMENT AND PLAN:  1.  Coronary artery disease: History of coronary artery bypass grafting.  The patient had recent cardiac catheterization on 07/14/2017 which revealed patent grafts.  He offers no new complaints.  Remains active.  Is planning on doing more traveling.  He is medically compliant.  He will continue dual antiplatelet therapy with Plavix and aspirin, metoprolol 25 mg daily.  And will follow.  The patient is refilling nitroglycerin as needed which is recommended to avoid decreased potency.  2.  Hypercholesterolemia: Remains on Livalo offers no side effects of myalgia or fatigue.   Current medicines are reviewed at length with the patient today.    Labs/ tests ordered today include: None.  Followed by PCP   Bettey Mare. Liborio Nixon, ANP, AACC   07/28/2017 10:40 AM    [facility] [address] [place], [place] [address] Phone: [phone]; Fax: [phone]

## 2017-07-28 ENCOUNTER — Encounter: Payer: Self-pay | Admitting: Adult Health

## 2017-07-28 ENCOUNTER — Ambulatory Visit: Payer: BLUE CROSS/BLUE SHIELD | Admitting: Adult Health

## 2017-07-28 VITALS — BP 115/82 | HR 84 | Ht 74.0 in | Wt 241.6 lb

## 2017-07-28 DIAGNOSIS — E7800 Pure hypercholesterolemia, unspecified: Secondary | ICD-10-CM | POA: Diagnosis not present

## 2017-07-28 DIAGNOSIS — I2510 Atherosclerotic heart disease of native coronary artery without angina pectoris: Secondary | ICD-10-CM

## 2017-07-28 MED ORDER — METOPROLOL SUCCINATE ER 25 MG PO TB24: 25.0000 mg | ORAL_TABLET | Freq: Every day | ORAL | 3 refills | Status: DC

## 2017-07-28 MED ORDER — CLOPIDOGREL BISULFATE 75 MG PO TABS: 75.0000 mg | ORAL_TABLET | Freq: Every day | ORAL | 3 refills | Status: DC

## 2017-07-28 MED ORDER — PITAVASTATIN CALCIUM 4 MG PO TABS: 4.0000 mg | ORAL_TABLET | Freq: Every evening | ORAL | 3 refills | Status: DC

## 2017-07-28 NOTE — Patient Instructions (Signed)
Medication Instructions:  NO CHANGES- Your physician recommends that you continue on your current medications as directed. Please refer to the Current Medication list given to you today.  If you need a refill on your cardiac medications before your next appointment, please call your pharmacy.  Follow-Up: Your physician wants you to follow-up in: 12 MONTHS WITH DR [name] You should receive a reminder letter in the mail two months in advance. If you do not receive a letter, please call our office MARCH 2020 to schedule the MAY 2020 follow-up appointment.   Thank you for choosing [facility] at [facility]!!

## 2017-11-25 ENCOUNTER — Telehealth: Payer: Self-pay | Admitting: Cardiology

## 2017-11-25 MED ORDER — PITAVASTATIN CALCIUM 4 MG PO TABS: 4.0000 mg | ORAL_TABLET | Freq: Every evening | ORAL | 2 refills | Status: DC

## 2017-11-25 NOTE — Telephone Encounter (Signed)
Refill for Pitavastatin 4mg  sent to pt pharmacy for #90 R-2

## 2017-11-25 NOTE — Telephone Encounter (Signed)
New message:      Pt c/o medication issue:  1. Name of Medication: Pitavastatin Calcium 4 MG TABS  2. How are you currently taking this medication (dosage and times per day)? Take 1 tablet (4 mg total) by mouth every evening.  3. Are you having a reaction (difficulty breathing--STAT)? No  4. What is your medication issue? [facility] is [facility] from [place] [facility] this medication. [facility]

## 2017-11-29 ENCOUNTER — Telehealth: Payer: Self-pay | Admitting: *Deleted

## 2017-11-29 NOTE — Telephone Encounter (Signed)
  KEY: [id number]  COVERMYMED - PRIOR AUTHORIZATION STARTED FOR LIVALO 4 MG     DX USED----E78.5  HYPERLIPIDEMIA W/TRGET LDL >70                      I21.9  STEMI OF INFERIOR WALL                      I25.0 PATIENT TRIED ATORVASTATIN 2014, ROUVASTATIN 2014, PRAVASTATIN 2016, FENOFIBRATE 2015 ---STOPPED DUE MEMORY ISSUES ,MYALGIAS   AWAITING ANSWER

## 2017-12-01 NOTE — Telephone Encounter (Signed)
informed pharmacy - livalo has been approved. patient aware

## 2017-12-12 DIAGNOSIS — H11153 Pinguecula, bilateral: Secondary | ICD-10-CM | POA: Diagnosis not present

## 2018-01-17 ENCOUNTER — Telehealth: Payer: Self-pay | Admitting: *Deleted

## 2018-01-17 NOTE — Telephone Encounter (Signed)
PRIMARY CARDIOLOGIST-- [facility] Pre-operative Risk Assessment    Request for surgical clearance:  1. What type of surgery is being performed?  HEMORROIDAL RUBBER BAND LIGATION  2. When is this surgery scheduled? TBD-  HEMORRHOIDS ARE BANDED IN 3 SEPARATE  PROCEDURES VISIT OFTEN OVER 2-3 WEEK APART  3. What type of clearance is required (medical clearance vs. Pharmacy clearance to hold med vs. Both)? MEDICAL  4. Are there any medications that need to be held prior to surgery and how long? PLAVIX 2 DAYS PRIOR TO THE BANDING PROCEDURE AND WILL REMAIN OFF PLAVIX 5 DAYS AFTER THE PROCEDURE  5. Practice name and name of physician performing surgery? [facility]; [place] PA-C    6. What is your office phone number [phone]   7.   What is your office fax number [phone]  8.   Anesthesia type (None, local, MAC, general) ? UNKNOWN   Jason Mathis 01/17/2018, 11:28 AM  _________________________________________________________________   (provider comments below)

## 2018-01-17 NOTE — Telephone Encounter (Signed)
   Primary Cardiologist: Bryan Lemma, MD  Chart reviewed as part of pre-operative protocol coverage. Patient was contacted 01/17/2018 in reference to pre-operative risk assessment for pending surgery as outlined below.  Jason Mathis was last seen on 07/28/17 by APP.  Since that day, Jason Mathis has done well. Easily getting > 4 mets of activity.   Therefore, based on ACC/AHA guidelines, the patient would be at acceptable risk for the planned procedure without further cardiovascular testing.    Dr. [name], please give plavix recommendations. Last cath 07/2017 showed widely patent stents n his RCA and proximal LAD.  Please forward your response to P [facility] PREOP.   Thank you   Manson Passey, PA 01/17/2018, 1:49 PM

## 2018-01-18 NOTE — Telephone Encounter (Signed)
Routing recommendations to requesting provider.  Rosalio Macadamia, RN, ANP-C [facility] [address] [place], [place]  [address] [phone]

## 2018-01-18 NOTE — Telephone Encounter (Signed)
MI with PCI in 2013 - 6 yrs post MI - OK to hold Plavix for procedures 5-7 days.  Bryan Lemma, MD

## 2018-01-25 DIAGNOSIS — K648 Other hemorrhoids: Secondary | ICD-10-CM | POA: Diagnosis not present

## 2018-01-25 DIAGNOSIS — I2510 Atherosclerotic heart disease of native coronary artery without angina pectoris: Secondary | ICD-10-CM | POA: Insufficient documentation

## 2018-02-08 DIAGNOSIS — K648 Other hemorrhoids: Secondary | ICD-10-CM | POA: Diagnosis not present

## 2018-02-17 DIAGNOSIS — K648 Other hemorrhoids: Secondary | ICD-10-CM | POA: Diagnosis not present

## 2018-07-07 ENCOUNTER — Telehealth: Payer: Self-pay | Admitting: Cardiology

## 2018-07-07 NOTE — Telephone Encounter (Signed)
LVM to call and schedule 1 year appt.

## 2018-07-25 IMAGING — DX DG CHEST 2V
2 series · 2 of 2 positions shown · non-contrast
Comparison: 01/21/2013

CLINICAL DATA: Tachycardia and diaphoresis

EXAM:
CHEST - 2 VIEW

[w chest pa]
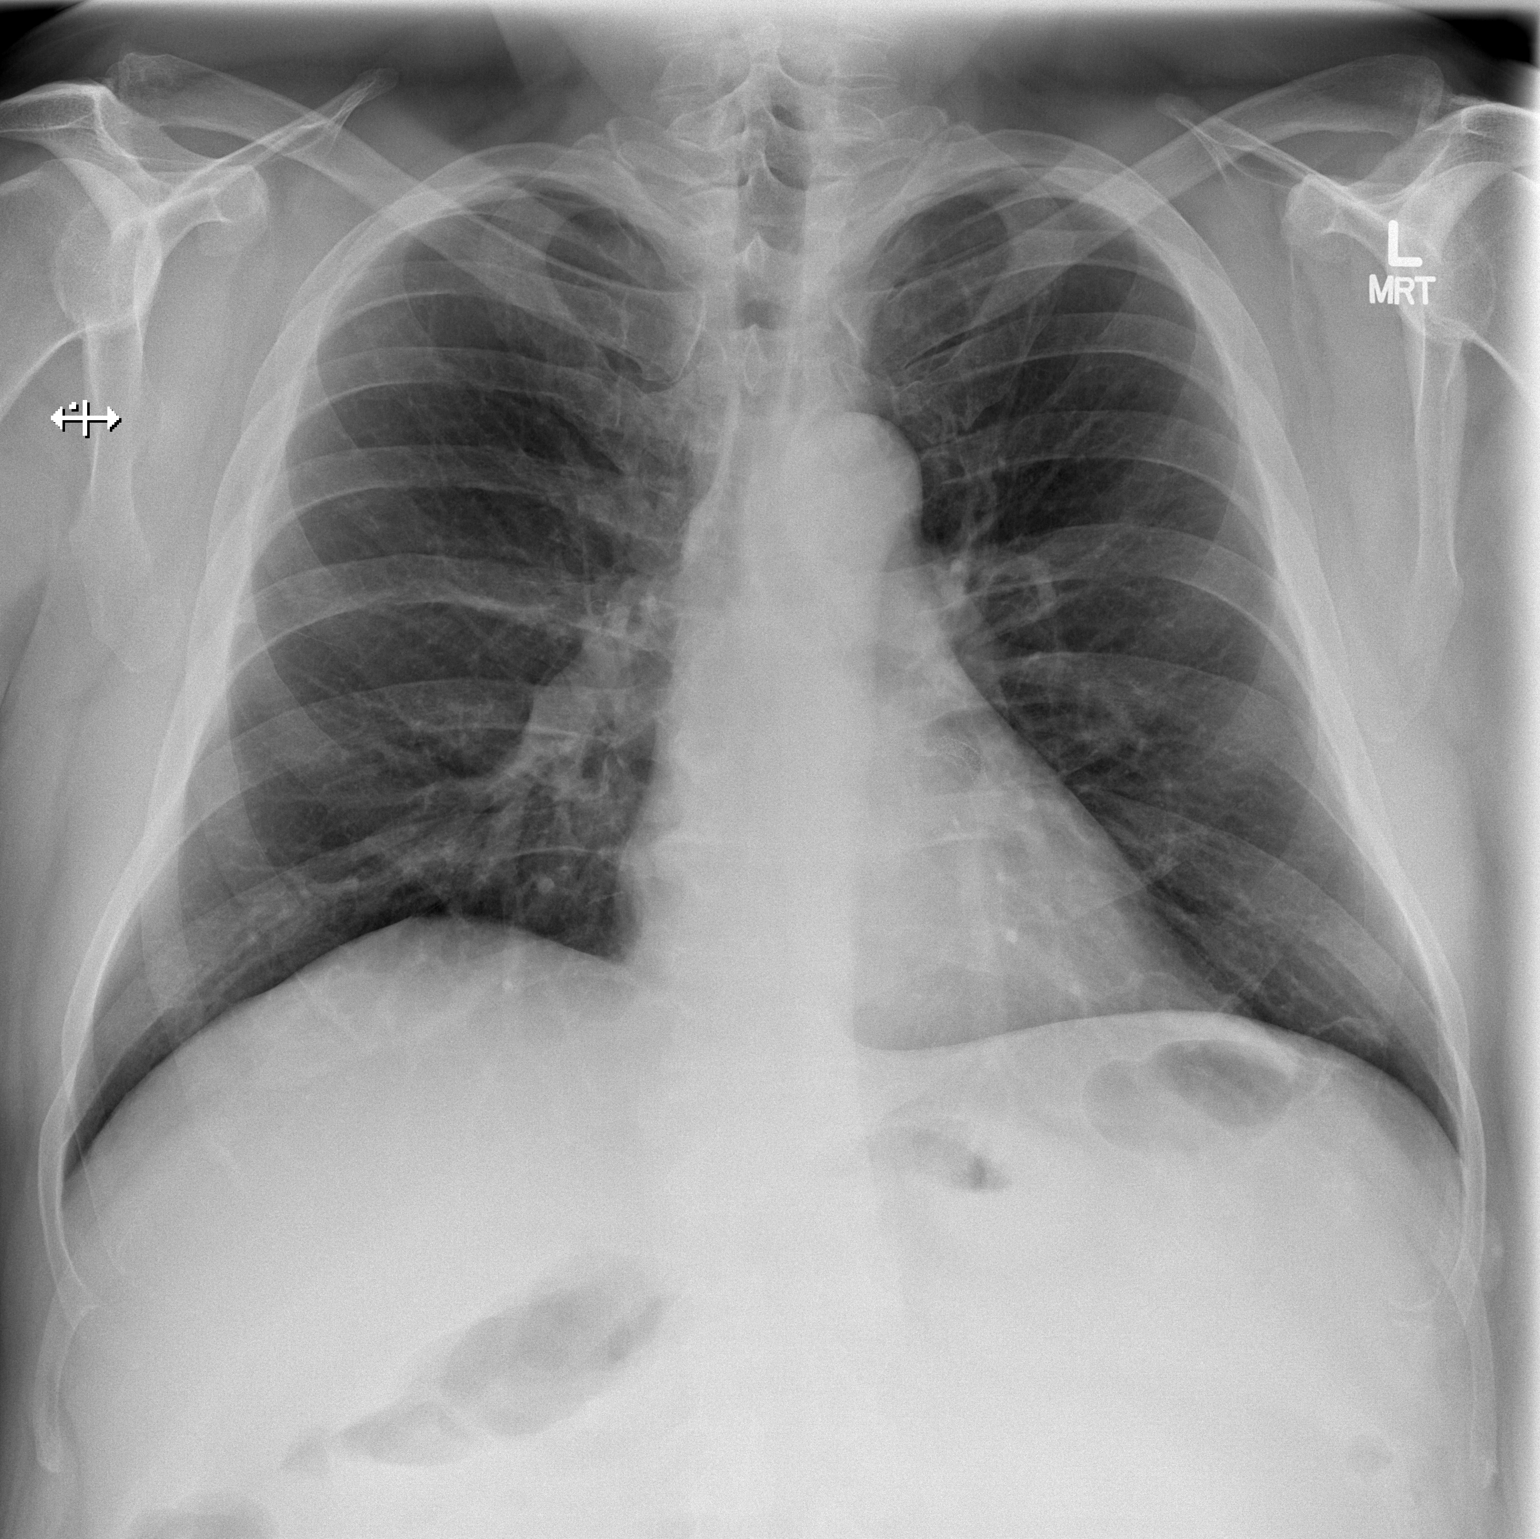

[w chest lat]
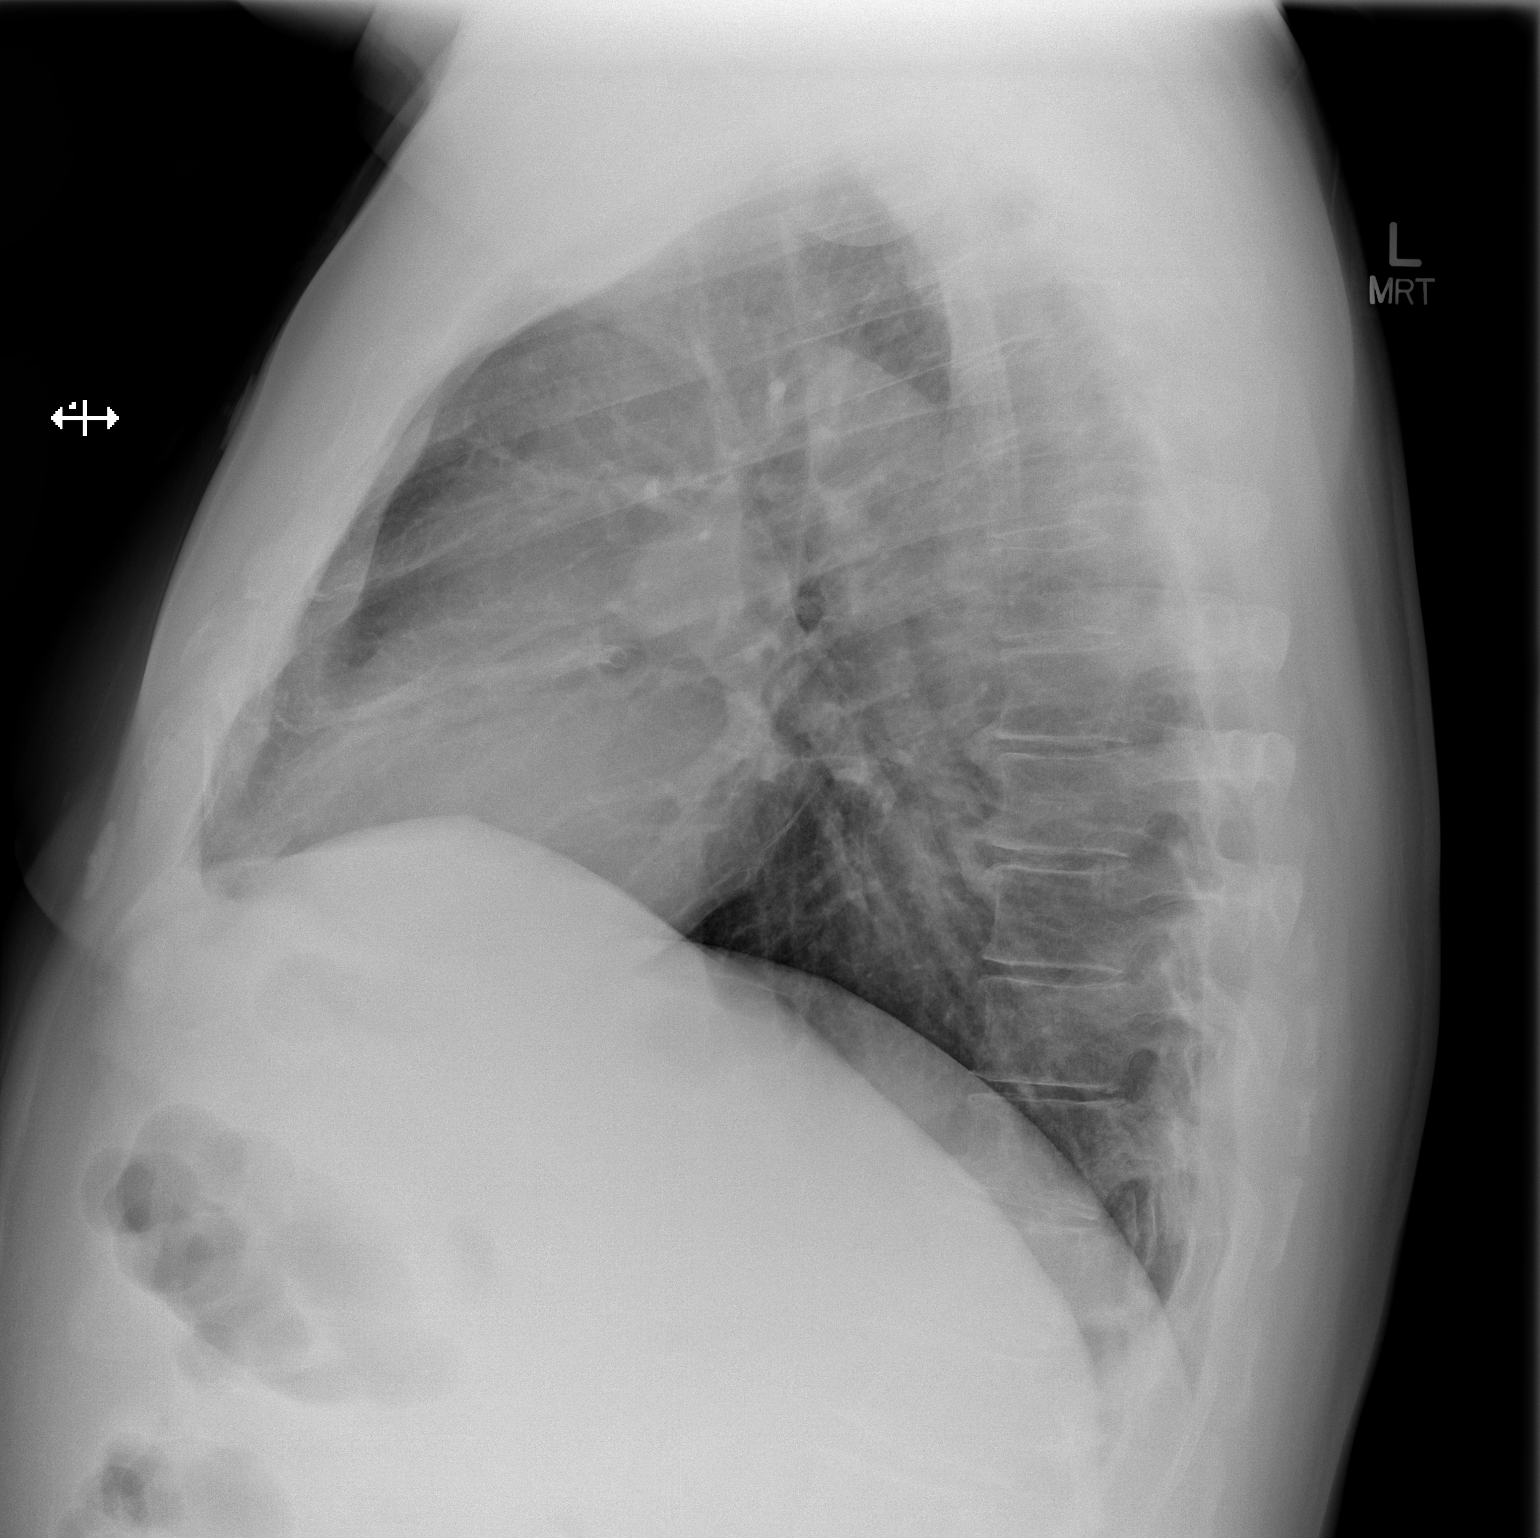

[2 of 2 positions shown; findings below may reference images not displayed]

FINDINGS: The heart size and mediastinal contours are within normal limits.
Both lungs are clear. The visualized skeletal structures are
unremarkable.
IMPRESSION: No active cardiopulmonary disease.

## 2018-09-15 ENCOUNTER — Telehealth: Payer: Self-pay | Admitting: Cardiology

## 2018-09-15 MED ORDER — CLOPIDOGREL BISULFATE 75 MG PO TABS: 75.0000 mg | ORAL_TABLET | Freq: Every day | ORAL | 0 refills | Status: DC

## 2018-09-15 MED ORDER — PITAVASTATIN CALCIUM 4 MG PO TABS: 4.0000 mg | ORAL_TABLET | Freq: Every evening | ORAL | 0 refills | Status: DC

## 2018-09-15 MED ORDER — METOPROLOL SUCCINATE ER 25 MG PO TB24: 25.0000 mg | ORAL_TABLET | Freq: Every day | ORAL | 0 refills | Status: DC

## 2018-09-15 NOTE — Telephone Encounter (Signed)
New Message             *STAT* If patient is at the pharmacy, call can be transferred to refill team.   1. Which medications need to be refilled? (please list name of each medication and dose if known) Metoprolol/Clopidogrel/Livalo  2. Which pharmacy/location (including street and city if local pharmacy) is medication to [facility] sent to?[facility]  3. Do they need a 30 day or 90 day supply? 90 supply

## 2018-09-15 NOTE — Telephone Encounter (Signed)
New Rx sent to requesting pharmacy.  

## 2018-09-22 DIAGNOSIS — J069 Acute upper respiratory infection, unspecified: Secondary | ICD-10-CM | POA: Diagnosis not present

## 2018-10-02 ENCOUNTER — Other Ambulatory Visit: Payer: Self-pay | Admitting: *Deleted

## 2018-10-02 MED ORDER — CLOPIDOGREL BISULFATE 75 MG PO TABS: 75.0000 mg | ORAL_TABLET | Freq: Every day | ORAL | 0 refills | Status: DC

## 2018-10-25 ENCOUNTER — Telehealth: Payer: Self-pay | Admitting: *Deleted

## 2018-10-25 NOTE — Telephone Encounter (Signed)
Left message for patient to call and reschedule 12/04/18 8:20 am appointment with Dr. Maxie Barb also send My Chart message

## 2018-12-04 ENCOUNTER — Ambulatory Visit: Payer: BLUE CROSS/BLUE SHIELD | Admitting: Cardiology

## 2018-12-06 ENCOUNTER — Other Ambulatory Visit: Payer: Self-pay

## 2018-12-06 ENCOUNTER — Ambulatory Visit: Payer: BC Managed Care – PPO | Admitting: Cardiology

## 2018-12-06 ENCOUNTER — Other Ambulatory Visit: Payer: Self-pay | Admitting: Cardiology

## 2018-12-06 ENCOUNTER — Encounter: Payer: Self-pay | Admitting: Cardiology

## 2018-12-06 VITALS — BP 147/95 | HR 94 | Temp 97.2°F | Ht 74.0 in | Wt 264.4 lb

## 2018-12-06 DIAGNOSIS — Z9861 Coronary angioplasty status: Secondary | ICD-10-CM | POA: Diagnosis not present

## 2018-12-06 DIAGNOSIS — E785 Hyperlipidemia, unspecified: Secondary | ICD-10-CM

## 2018-12-06 DIAGNOSIS — E669 Obesity, unspecified: Secondary | ICD-10-CM

## 2018-12-06 DIAGNOSIS — I2510 Atherosclerotic heart disease of native coronary artery without angina pectoris: Secondary | ICD-10-CM

## 2018-12-06 DIAGNOSIS — I10 Essential (primary) hypertension: Secondary | ICD-10-CM

## 2018-12-06 DIAGNOSIS — E8881 Metabolic syndrome: Secondary | ICD-10-CM

## 2018-12-06 MED ORDER — METOPROLOL SUCCINATE ER 50 MG PO TB24: 50.0000 mg | ORAL_TABLET | Freq: Every day | ORAL | 6 refills | Status: DC

## 2018-12-06 NOTE — Progress Notes (Signed)
PCP: Orpah Melter, MD  Clinic Note: Chief Complaint  Patient presents with   Follow-up    Annual   Coronary Artery Disease    No angina   Hyperlipidemia    On Livalo because of memory issues on other statins    HPI: Jason Mathis is a 63 y.o. male with a history of inferior MI (CAD with PCI to occluded RCA followed by staged PCI to LAD after abnormal Myoview) along with hypertension hyperlipidemia who presents today for annual   Inferior STEMI in May 2013 with occluded RCA.   He then had Staged PCI-LAD for abnormal Myoview post MI.  He had memory issues with multiple different statins, and is finally now on Livalo, and doing well  Jason Mathis was last seen in April 2019.  He had just recovered from pulling a muscle in his arm so he had not been able to do his routine kayaking exercise which is his main aerobic activity.  Gained little bit of weight at that time.  Otherwise doing well.  No changes made.  Recent Hospitalizations:   Admitted for chest pain on Jul 13, 2017.  Had a cardiac catheterization and echocardiogram--thought to be noncardiac pain.  He was seen in hospital follow-up by Jory Sims, NP later on in May 2019 and was doing well.  Looking forward to his upcoming trip to [place] in September 2019.  Studies Personally Reviewed - (if available, images/films reviewed: From Epic Chart or Care Everywhere)  Cardiac Cath Jul 14, 2017: Ostial-proximal RCA stent patent.  Proximal-mid LAD stent patent.  Normal EF 55-60%.  Echo Jul 14, 2017: EF 55 to 60%.  No R WMA.  GR 1 DD.  Mild LA dilation.  Otherwise normal  Interval History: Jason Mathis returns today doing well as usual doing quite well.  He does note that he had minimal [place] until the Coronavirus restrictions were somewhat lifted.  Therefore he is got a little better shape and has gained some weight.  He is now excited to be able to be back on the lake kayaking.  Has  been doing some long walks/hikes with his kids and noted that he could not keep up with them anymore, especially on the hills.  He is put on weight and his blood pressure is gone up a little bit, so he knows he needs to buckle back down again and watch his diet get back into his exercise now that things are lifting up with COVID-19 restrictions.  Despite having little exertional dyspnea because of deconditioning, he is not having any exertional chest pain or pressure.  No heart failure symptoms of PND, orthopnea or edema.  He thinks the episode he had last May was probably little anxiety related because it was right around the same day that he had had his MI in 2013.  He has not had any more those symptoms since.   He & his wife completed their 2 week trip to [place] back in September.  They did all kinds of fun activities driving from [place] down on the [employer] [place] then around up to the [place] before heading back to [place].  They are looking for another trip.  He has become quite the [place].    Cardiovascular review of symptoms: positive for - Some deconditioning related exertional dyspnea, also noting that he has gained weight negative for - chest pain, edema, irregular heartbeat, murmur,  orthopnea, palpitations, paroxysmal nocturnal dyspnea, rapid heart rate, shortness of breath or Syncope/near syncope, TIA/amaurosis fugax, claudication   ROS: A comprehensive was performed. Review of Systems  Constitutional: Negative for malaise/fatigue and weight loss (Has clearly gained more weight).  HENT: Negative for congestion and nosebleeds.   Respiratory: Negative for cough, shortness of breath and wheezing.   Gastrointestinal: Negative for blood in stool and melena.  Genitourinary: Negative for hematuria.  Musculoskeletal: Positive for joint pain (He does not do as well with prolonged walks as he used to.  Noting hips and knees that bother him after long  walks.). Negative for falls.  Neurological: Negative for dizziness and weakness.  Endo/Heme/Allergies: Negative for environmental allergies. Does not bruise/bleed easily.  Psychiatric/Behavioral: Negative for memory loss. The patient is not nervous/anxious and does not have insomnia.   All other systems reviewed and are negative.  I have reviewed and (if needed) personally updated the patient's problem list, medications, allergies, past medical and surgical history, social and family history.   Past Medical History:  Diagnosis Date   CAD S/P percutaneous coronary angioplasty 07/14/2011   Inferior STEMI 07/2011 - Promus Element DES 3 x 16 (3.25 mm) for 100% ost RCA, residual ~60-80% p-m LAD disease noted -> post MI Myoview with Anterior Ischemia --> (09/07/2011) Staged PCI of the LAD - Promus Element DES 4.0x2628mm (4.647mm) --> stents patent of Cath 07/14/2017   Chronic bronchitis (HCC)    "used to get it every year til a few years ago" (09/07/11)   Dyslipidemia, goal LDL below 70 07/15/2011   H/O echocardiogram 07/15/2011   Normal EF 55-60%; no WMA   History of pneumonia 2003   History of ST elevation myocardial infarction (STEMI) of inferior wall 07/15/2011   Culprit lesion 100% ostRCA: PCI of ost-prox RCA; noted p-m LAD ~70% (planned OP eval - Myoview --> staged PCI)   Hypertension    Kidney stones    Metabolic syndrome    Hgb AiC 6.1 -- low HDL, elevated blood glucose, hypertension   Obesity (BMI 30-39.9)    Syncope and collapse April 2014   Post-micturition with orthostatic tendencies.    Past Surgical History:  Procedure Laterality Date   ANAL SPHINCTEROTOMY  1996   KNEE ARTHROSCOPY  1999; 9/20099   r/t left knee meniscal tear   LEFT HEART CATH AND CORONARY ANGIOGRAPHY N/A 07/14/2017   Procedure: LEFT HEART CATH AND CORONARY ANGIOGRAPHY;  Surgeon: Runell GessBerry, Jonathan J, MD;  Location: [facility] IN[facility]ASIVE [facility]; ; Ostial-proximal RCA stent patent.  Proximal-mid LAD stent patent.  Normal  EF 55-60%.   LEFT HEART CATHETERIZATION WITH CORONARY ANGIOGRAM N/A 07/14/2011   Procedure: LEFT HEART CATHETERIZATION WITH CORONARY ANGIOGRAM;  Surgeon: Marykay Lexavid W Alek Borges, MD;  [facility];; Inf STEMI: 100% pRCA (PCI) -> 30% mid - RPDA & RPL1, 80% pLDA-60% (@ D1 that is med caliber bifurcating vessel ~20%), patent large RI & non-dom Cx-Lat OM.   LEFT HEART CATHETERIZATION WITH CORONARY ANGIOGRAM N/A 09/07/2011   Procedure: LEFT HEART CATHETERIZATION WITH CORONARY ANGIOGRAM;  Surgeon: Marykay Lexavid W Telly Jawad, MD;  [facility];; widely patent RCA stent with 30% mid disease.  Persistent LAD roughly 80%, 60% lesions surrounding trifurcation into the large D1 and S/P 1 ([place] 1,0,1-with minimal involvement of the diagonal branch.)  FFR highly positive.  Widely patent RI  & Cx-LatOM   LITHOTRIPSY  1999   PERCUTANEOUS CORONARY STENT INTERVENTION (PCI-S)  07/14/2011   Procedure: PERCUTANEOUS CORONARY STENT INTERVENTION (PCI-S);  Surgeon: Piedad Climesavid W  [name], MD;  Location: [facility] CATH LAB;;; INF STEMI --> PCI of 100% RCA with Promus Element 3.0 mm x 16 mm DES; final diameter 3.25 mm   PERCUTANEOUS CORONARY STENT INTERVENTION (PCI-S) Right 09/07/2011   Procedure: PERCUTANEOUS CORONARY STENT INTERVENTION (PCI-S);  Surgeon: [name], MD;  Location: [facility] CATH LAB;; Staged PCI of  p-mLAD with Promus Element DES 4.0x45mm postdilated to 4.33mm (Abn Myoview & highly + FFR)   TONSILLECTOMY  1968   TRANSTHORACIC ECHOCARDIOGRAM  07/14/2017   EF 55 to 60%.  No R WMA.  GR 1 DD.  Mild LA dilation.  Otherwise normal    Current Meds  Medication Sig   aspirin EC 81 MG EC tablet Take 1 tablet (81 mg total) by mouth daily.   cetirizine (ZYRTEC) 10 MG tablet Take 10 mg by mouth daily.   clopidogrel (PLAVIX) 75 MG tablet Take 1 tablet (75 mg total) by mouth daily.   cyproheptadine (PERIACTIN) 4 MG tablet Take 4 mg by mouth daily.   nitroGLYCERIN (NITROSTAT) 0.4 MG SL tablet Place 1 tablet (0.4 mg total) under  the tongue every 5 (five) minutes as needed for chest pain.   [DISCONTINUED] metoprolol succinate (TOPROL-XL) 25 MG 24 hr tablet Take 1 tablet (25 mg total) by mouth daily.   [DISCONTINUED] Pitavastatin Calcium 4 MG TABS Take 1 tablet (4 mg total) by mouth every evening.    Allergies  Allergen Reactions   Atorvastatin     Memory issues   Crestor [Rosuvastatin]     Memory issues    Pravastatin Other (See Comments)    Memory issues   Zetia [Ezetimibe]     Myalgias     Social History   Tobacco Use   Smoking status: Never Smoker   Smokeless tobacco: Former Neurosurgeon    Types: Chew  Substance Use Topics   Alcohol use: Yes    Alcohol/week: 7.0 standard drinks    Types: 7 Glasses of wine per week    Comment: 11/28/2012-occasional glass of wine, 09/07/11 "nothing since MI 07/14/11"   Drug use: No   Social History   Social History Narrative   Married, father of 2. He is a Scientist, research (physical sciences). He exercises routinely about 3 days a week for about 60 minutes a day. Does not smoke does not drink. He has although the weight due to less frequent exercise and dietary modification.   He has a PhD in Animal nutritionist; but currently works as a Oceanographer.   family history includes Coronary artery disease (age of onset: 61) in his father; Heart Problems in his maternal grandmother; Hyperlipidemia in his mother; Pneumonia in his maternal grandfather.  Wt Readings from Last 3 Encounters:  12/06/18 264 lb 6.4 oz (119.9 kg)  07/28/17 241 lb 9.6 oz (109.6 kg)  07/15/17 232 lb 3.2 oz (105.3 kg)    PHYSICAL EXAM BP (!) 147/95    Pulse 94    Temp (!) 97.2 F (36.2 C)    Ht 6\' 2"  (1.88 m)    Wt 264 lb 6.4 oz (119.9 kg)    SpO2 94%    BMI 33.95 kg/m  Physical Exam  Constitutional: He is oriented to person, place, and time. He appears well-developed and well-nourished. No distress.  Healthy-appearing.  Well-groomed.  Notably heavier  HENT:  Head: Normocephalic and atraumatic.    Neck: No hepatojugular reflux and no JVD present. Carotid bruit is not present.  Cardiovascular: Normal rate, regular rhythm, normal heart sounds and intact distal pulses.  No extrasystoles are present. PMI is not displaced. Exam reveals no gallop and no friction rub.  No murmur heard. Pulmonary/Chest: Effort normal and breath sounds normal. No respiratory distress. He has no wheezes. He has no rales.  Abdominal: Soft. Bowel sounds are normal. He exhibits no distension. There is no abdominal tenderness. There is no rebound.  Musculoskeletal: Normal range of motion.        General: No edema.  Neurological: He is alert and oriented to person, place, and time.  Psychiatric: He has a normal mood and affect. His behavior is normal. Judgment and thought content normal.  Vitals reviewed.    Adult ECG Report  Rate: 88 ;  Rhythm: normal sinus rhythm and Inferior MI, age undetermined.  Otherwise normal axis, intervals and durations.;   Narrative Interpretation: Stable EKG   Other studies Reviewed: Additional studies/ records that were reviewed today include:  Recent Labs: Last set of lipids were from May 2019 -TC 146, TG 133, HDL 34, LDL 85.  A1c 5.5.  Hgb 13.8.  Creatinine 0.78 and K+ 3.9. ->  Should be due to get labs checked.   ASSESSMENT / PLAN: Problem List Items Addressed This Visit    CAD S/P -PCI: PCI RCA during STEMI, staged PCI to LAD residual lesion (Abn Nuc ST) - Primary (Chronic)    He is 7 years out from his MI and staged PCI.  Cath last year showed widely patent stents.  No angina.  Just little bit dyspnea from having gained weight getting out of shape. . Plan: Continue Plavix but okay to stop aspirin  Okay to hold Plavix for any procedures 5 to 7 days preop.  Currently on Toprol 25 mg, with higher blood pressure and stable heart rate, will increase to 50 mg daily.  Continue statin      Relevant Medications   metoprolol succinate (TOPROL-XL) 50 MG 24 hr tablet   Other  Relevant Orders   EKG 12-Lead (Completed)   Lipid panel   Comprehensive metabolic panel   Hyperlipidemia with target LDL less than 70 (Chronic)    He seems to be tolerating Livalo, but by last check his lipids were not quite at goal. Due for follow-up labs this year which we will check in about a month to reassess his blood pressure.  May need to consider referral to CV RR(Cardiovascular Risk Reduction)-Lipid Clinic to consider the possibility of either PCSK9 hematuria or possibly Nexletol to get him fully to goal.      Relevant Medications   metoprolol succinate (TOPROL-XL) 50 MG 24 hr tablet   Other Relevant Orders   Lipid panel   Comprehensive metabolic panel   Obesity (BMI 25-95.630-39.9) (Chronic)   Metabolic syndrome, obese, elevated glucose, Hgb A1c 6.1 (Chronic)    Reiterated the importance of getting back to some dietary discretion and certainly getting back to exercising.  He had been about 40 pounds lighter than he is now and feeling much better.  His A1c has been fine at 5.5 I think were okay holding off on that now.      Relevant Orders   Lipid panel   Comprehensive metabolic panel   Essential hypertension (Chronic)    Pressures are little higher.  Tolerating Toprol for now.   Plan: Increase Toprol to 50 mg daily.  He will follow his blood pressure over the next month and see how he does. --> Will have blood pressure check in 1 month when he comes in for having his labs checked.  Relevant Medications   metoprolol succinate (TOPROL-XL) 50 MG 24 hr tablet      I spent a total of 24 minutes with the patient and chart review. >  50% of the time was spent in direct patient consultation.   Current medicines are reviewed at length with the patient today.  (+/- concerns) n/a  Patient Instructions  Medication Instructions:  FOR THE NEXT MONTH - TAKE 50 MG TOPROL XL ( METOPROLOL SUCCINATE  2( 25 MG ) OR 1 (50 MG) TABLET  NO CHANGES WITH OTHER MEDICATIONS  If you need  a refill on your cardiac medications before your next appointment, please call your pharmacy.   Lab work: FASTING LIPID IN ONE MONTH  CMP  If you have labs (blood work) drawn today and your tests are completely normal, you will receive your results only by:  MyChart Message (if you have MyChart) OR  A paper copy in the mail If you have any lab test that is abnormal or we need to change your treatment, we will call you to review the results.  Testing/Procedures:  NOT NEEDED   Follow-Up: At [facility], you and your health needs are our priority.  As part of our continuing mission to provide you with exceptional heart care, we have created designated Provider Care Teams.  These Care Teams include your primary Cardiologist (physician) and Advanced Practice Providers (APPs -  Physician Assistants and Nurse Practitioners) who all work together to provide you with the care you need, when you need it.  You will need a follow up appointment in 12 months-SEPT 2021.  Please call our office 2 months in advance to schedule this appointment.  You may see Bryan Lemma, MD or one of the following Advanced Practice Providers on your designated Care Team:    Theodore Demark, PA-C  Joni Reining, DNP, ANP  Your physician recommends that you schedule a follow-up appointment in 1 MONTH WITH  NURSE FOR  BLOOD PRESSURE CHECK AND HAVE LAB DRAWN.  Any Other Special Instructions Will Be Listed Below (If Applicable).    Studies Ordered:   Orders Placed This Encounter  Procedures   Lipid panel   Comprehensive metabolic panel   EKG 12-Lead      Bryan Lemma, M.D., M.S. Interventional Cardiologist   Pager # [phone] Phone # [phone] [address] [place], [place] [address]   Thank you for choosing Heartcare at [facility]!!

## 2018-12-06 NOTE — Patient Instructions (Addendum)
Medication Instructions:  FOR THE NEXT MONTH - TAKE 50 MG TOPROL XL ( METOPROLOL SUCCINATE  2( 25 MG ) OR 1 (50 MG) TABLET  NO CHANGES WITH OTHER MEDICATIONS  If you need a refill on your cardiac medications before your next appointment, please call your pharmacy.   Lab work: FASTING LIPID IN ONE MONTH  CMP  If you have labs (blood work) drawn today and your tests are completely normal, you will receive your results only by: Marland Kitchen MyChart Message (if you have MyChart) OR . A paper copy in the mail If you have any lab test that is abnormal or we need to change your treatment, we will call you to review the results.  Testing/Procedures:  NOT NEEDED   Follow-Up: At [facility], you and your health needs are our priority.  As part of our continuing mission to provide you with exceptional heart care, we have created designated Provider Care Teams.  These Care Teams include your primary Cardiologist (physician) and Advanced Practice Providers (APPs -  Physician Assistants and Nurse Practitioners) who all work together to provide you with the care you need, when you need it. . You will need a follow up appointment in 12 months-SEPT 2021.  Please call our office 2 months in advance to schedule this appointment.  You may see Glenetta Hew, MD or one of the following Advanced Practice Providers on your designated Care Team:   . Rosaria Ferries, PA-C . Jory Sims, DNP, ANP  Your physician recommends that you schedule a follow-up appointment in [place] AND HAVE LAB DRAWN.  Any Other Special Instructions Will Be Listed Below (If Applicable).

## 2018-12-07 DIAGNOSIS — R05 Cough: Secondary | ICD-10-CM | POA: Diagnosis not present

## 2018-12-09 ENCOUNTER — Encounter: Payer: Self-pay | Admitting: Cardiology

## 2018-12-09 NOTE — Assessment & Plan Note (Signed)
Reiterated the importance of getting back to some dietary discretion and certainly getting back to exercising.  He had been about 40 pounds lighter than he is now and feeling much better.  His A1c has been fine at 5.5 I think were okay holding off on that now.

## 2018-12-09 NOTE — Assessment & Plan Note (Signed)
He is 7 years out from his MI and staged PCI.  Cath last year showed widely patent stents.  No angina.  Just little bit dyspnea from having gained weight getting out of shape. . Plan: Continue Plavix but okay to stop aspirin  Okay to hold Plavix for any procedures 5 to 7 days preop.  Currently on Toprol 25 mg, with higher blood pressure and stable heart rate, will increase to 50 mg daily.  Continue statin

## 2018-12-09 NOTE — Assessment & Plan Note (Signed)
He seems to be tolerating Livalo, but by last check his lipids were not quite at goal. Due for follow-up labs this year which we will check in about a month to reassess his blood pressure.  May need to consider referral to CV RR(Cardiovascular Risk Reduction)-Lipid Clinic to consider the possibility of either PCSK9 hematuria or possibly Nexletol to get him fully to goal.

## 2018-12-09 NOTE — Assessment & Plan Note (Signed)
Pressures are little higher.  Tolerating Toprol for now.   Plan: Increase Toprol to 50 mg daily.  He will follow his blood pressure over the next month and see how he does. --> Will have blood pressure check in 1 month when he comes in for having his labs checked.

## 2018-12-18 ENCOUNTER — Telehealth: Payer: Self-pay | Admitting: *Deleted

## 2018-12-18 NOTE — Telephone Encounter (Signed)
PRIOR AUTHORIZATION FOR LIVALO 4 MG  TO CONTINUE FOR THIS YEAR  KEY [id number]  DX: E78.5, I21.9  PATIENT TRIED ATORVASTATIN 2014, ROUVASTATIN 2014, PRAVASTATIN 2016, FENOFIBRATE 2015 ---STOPPED DUE MEMORY ISSUES ,MYALGIAS  AWAITING OUTCOME.

## 2019-01-01 NOTE — Telephone Encounter (Signed)
Medication approved

## 2019-01-15 ENCOUNTER — Encounter: Payer: Self-pay | Admitting: *Deleted

## 2019-01-15 ENCOUNTER — Ambulatory Visit (INDEPENDENT_AMBULATORY_CARE_PROVIDER_SITE_OTHER): Payer: BC Managed Care – PPO | Admitting: *Deleted

## 2019-01-15 ENCOUNTER — Other Ambulatory Visit: Payer: Self-pay

## 2019-01-15 ENCOUNTER — Ambulatory Visit: Payer: BC Managed Care – PPO

## 2019-01-15 VITALS — BP 132/94 | HR 91

## 2019-01-15 DIAGNOSIS — I2510 Atherosclerotic heart disease of native coronary artery without angina pectoris: Secondary | ICD-10-CM | POA: Diagnosis not present

## 2019-01-15 DIAGNOSIS — E8881 Metabolic syndrome: Secondary | ICD-10-CM | POA: Diagnosis not present

## 2019-01-15 DIAGNOSIS — I10 Essential (primary) hypertension: Secondary | ICD-10-CM

## 2019-01-15 DIAGNOSIS — E785 Hyperlipidemia, unspecified: Secondary | ICD-10-CM | POA: Diagnosis not present

## 2019-01-15 DIAGNOSIS — Z9861 Coronary angioplasty status: Secondary | ICD-10-CM | POA: Diagnosis not present

## 2019-01-15 LAB — LIPID PANEL
Chol/HDL Ratio: 5 ratio (ref 0.0–5.0)
Cholesterol, Total: 195 mg/dL (ref 100–199)
HDL: 39 mg/dL — ABNORMAL LOW (ref >39)
LDL Chol Calc (NIH): 111 mg/dL — ABNORMAL HIGH (ref 0–99)
Triglycerides: 257 mg/dL — ABNORMAL HIGH (ref 0–149)
VLDL Cholesterol Cal: 45 mg/dL — ABNORMAL HIGH (ref 5–40)

## 2019-01-15 LAB — COMPREHENSIVE METABOLIC PANEL
ALT: 25 IU/L (ref 0–44)
AST: 21 IU/L (ref 0–40)
Albumin/Globulin Ratio: 2.2 (ref 1.2–2.2)
Albumin: 4.3 g/dL (ref 3.8–4.8)
Alkaline Phosphatase: 78 IU/L (ref 39–117)
BUN/Creatinine Ratio: 16 (ref 10–24)
BUN: 14 mg/dL (ref 8–27)
Bilirubin Total: 0.6 mg/dL (ref 0.0–1.2)
CO2: 21 mmol/L (ref 20–29)
Calcium: 8.7 mg/dL (ref 8.6–10.2)
Chloride: 101 mmol/L (ref 96–106)
Creatinine, Ser: 0.86 mg/dL (ref 0.76–1.27)
GFR calc Af Amer: 107 mL/min/{1.73_m2} (ref >59)
GFR calc non Af Amer: 93 mL/min/{1.73_m2} (ref >59)
Globulin, Total: 2 g/dL (ref 1.5–4.5)
Glucose: 89 mg/dL (ref 65–99)
Potassium: 3.9 mmol/L (ref 3.5–5.2)
Sodium: 138 mmol/L (ref 134–144)
Total Protein: 6.3 g/dL (ref 6.0–8.5)

## 2019-01-15 NOTE — Progress Notes (Signed)
1.) Reason for visit: BP check per MD  2.) Name of MD requesting visit: Ellyn Hack MD  3.) H&P: CAD, HTN, HLD, Obesity   4.) ROS related to problem: BP higher at 12/06/2018 visit. MD suggested med increase, advised to check BP at home.     Patient presented for nurse visit today 01/15/2019 - BP 132/94 HR 91  He reports he has NOT checked vitals at home  He reports he had NOT taken his medications the day of or day prior to his   last MD appointment  5.) Assessment and plan per MD: BP/HR readings from 01/15/2019 provided to MD. He advise NO changes, encouraged work on weight loss. Patient advised. He completed necessary lab work per last visit's orders.

## 2019-01-17 NOTE — Progress Notes (Signed)
Blood pressures look pretty good.  Continue current regimen.  Glenetta Hew, MD

## 2019-01-24 ENCOUNTER — Telehealth: Payer: Self-pay | Admitting: *Deleted

## 2019-01-24 DIAGNOSIS — Z9861 Coronary angioplasty status: Secondary | ICD-10-CM

## 2019-01-24 DIAGNOSIS — E785 Hyperlipidemia, unspecified: Secondary | ICD-10-CM

## 2019-01-24 DIAGNOSIS — I2510 Atherosclerotic heart disease of native coronary artery without angina pectoris: Secondary | ICD-10-CM

## 2019-01-24 MED ORDER — FENOFIBRATE 54 MG PO TABS: 54.0000 mg | ORAL_TABLET | Freq: Every day | ORAL | 3 refills | Status: DC

## 2019-01-24 NOTE — Telephone Encounter (Signed)
-----   Message from Leonie Man, MD sent at 01/17/2019 12:16 AM EST ----- Unfortunately, LDL is still up and triglycerides are up.  I would like to try maybe low-dose fenofibrate to see how that works for the triglycerides and may also help LDL.  That may not be the final solution and the next choice would be Nexletol versus PCSK9 inhibitor.  This would require referral to lipid clinic.  Also need to pay close attention to diet & get back exercising.  For now, new Rx: Fenofibrate (54 or 48 mg - depending on availability) 1 tab PO daily. Disp #90, 3 refills - recheck lipids in 2 months.

## 2019-01-24 NOTE — Telephone Encounter (Signed)
Patient reviewed via mychart -  Medication e-sent to pharmacy   Patient aware

## 2019-03-20 DIAGNOSIS — F332 Major depressive disorder, recurrent severe without psychotic features: Secondary | ICD-10-CM | POA: Diagnosis not present

## 2019-05-14 ENCOUNTER — Other Ambulatory Visit: Payer: Self-pay | Admitting: Adult Health

## 2019-05-29 ENCOUNTER — Other Ambulatory Visit: Payer: Self-pay | Admitting: Cardiology

## 2019-06-18 DIAGNOSIS — F419 Anxiety disorder, unspecified: Secondary | ICD-10-CM | POA: Diagnosis not present

## 2019-06-18 DIAGNOSIS — F332 Major depressive disorder, recurrent severe without psychotic features: Secondary | ICD-10-CM | POA: Diagnosis not present

## 2019-08-04 ENCOUNTER — Other Ambulatory Visit: Payer: Self-pay | Admitting: Cardiology

## 2019-08-27 ENCOUNTER — Ambulatory Visit: Payer: Self-pay | Admitting: *Deleted

## 2019-08-27 NOTE — Telephone Encounter (Signed)
COVID-19 test scheduled. 08/29/2019 at 8:15 AM.

## 2019-08-28 ENCOUNTER — Emergency Department (HOSPITAL_COMMUNITY)
Admission: EM | Admit: 2019-08-28 | Discharge: 2019-08-28 | Disposition: A | Discharge status: Home / Self Care | Payer: BC Managed Care – PPO | Attending: Emergency Medicine | Admitting: Emergency Medicine

## 2019-08-28 ENCOUNTER — Encounter (HOSPITAL_COMMUNITY): Payer: Self-pay | Admitting: Emergency Medicine

## 2019-08-28 ENCOUNTER — Other Ambulatory Visit: Payer: Self-pay

## 2019-08-28 ENCOUNTER — Emergency Department (HOSPITAL_COMMUNITY): Payer: BC Managed Care – PPO

## 2019-08-28 DIAGNOSIS — R519 Headache, unspecified: Secondary | ICD-10-CM | POA: Insufficient documentation

## 2019-08-28 DIAGNOSIS — Z87891 Personal history of nicotine dependence: Secondary | ICD-10-CM | POA: Diagnosis not present

## 2019-08-28 DIAGNOSIS — R079 Chest pain, unspecified: Secondary | ICD-10-CM | POA: Diagnosis not present

## 2019-08-28 DIAGNOSIS — Z20822 Contact with and (suspected) exposure to covid-19: Secondary | ICD-10-CM | POA: Insufficient documentation

## 2019-08-28 DIAGNOSIS — I2510 Atherosclerotic heart disease of native coronary artery without angina pectoris: Secondary | ICD-10-CM | POA: Diagnosis not present

## 2019-08-28 DIAGNOSIS — I10 Essential (primary) hypertension: Secondary | ICD-10-CM | POA: Diagnosis not present

## 2019-08-28 DIAGNOSIS — R05 Cough: Secondary | ICD-10-CM | POA: Diagnosis not present

## 2019-08-28 DIAGNOSIS — I252 Old myocardial infarction: Secondary | ICD-10-CM | POA: Diagnosis not present

## 2019-08-28 DIAGNOSIS — R0789 Other chest pain: Secondary | ICD-10-CM | POA: Insufficient documentation

## 2019-08-28 DIAGNOSIS — Z79899 Other long term (current) drug therapy: Secondary | ICD-10-CM | POA: Diagnosis not present

## 2019-08-28 DIAGNOSIS — Z7982 Long term (current) use of aspirin: Secondary | ICD-10-CM | POA: Insufficient documentation

## 2019-08-28 DIAGNOSIS — R21 Rash and other nonspecific skin eruption: Secondary | ICD-10-CM | POA: Diagnosis not present

## 2019-08-28 DIAGNOSIS — Z7901 Long term (current) use of anticoagulants: Secondary | ICD-10-CM | POA: Diagnosis not present

## 2019-08-28 DIAGNOSIS — R509 Fever, unspecified: Secondary | ICD-10-CM | POA: Diagnosis not present

## 2019-08-28 DIAGNOSIS — J189 Pneumonia, unspecified organism: Secondary | ICD-10-CM | POA: Insufficient documentation

## 2019-08-28 LAB — URINALYSIS, ROUTINE W REFLEX MICROSCOPIC
Bacteria, UA: NONE SEEN
Bilirubin Urine: NEGATIVE
Glucose, UA: NEGATIVE mg/dL
Ketones, ur: NEGATIVE mg/dL
Leukocytes,Ua: NEGATIVE
Nitrite: NEGATIVE
Protein, ur: 100 mg/dL — AB
Specific Gravity, Urine: 1.029 (ref 1.005–1.030)
pH: 5 (ref 5.0–8.0)

## 2019-08-28 LAB — CBC
HCT: 38.7 % — ABNORMAL LOW (ref 39.0–52.0)
Hemoglobin: 12.8 g/dL — ABNORMAL LOW (ref 13.0–17.0)
MCH: 29.4 pg (ref 26.0–34.0)
MCHC: 33.1 g/dL (ref 30.0–36.0)
MCV: 89 fL (ref 80.0–100.0)
Platelets: 180 10*3/uL (ref 150–400)
RBC: 4.35 MIL/uL (ref 4.22–5.81)
RDW: 13.6 % (ref 11.5–15.5)
WBC: 5.8 10*3/uL (ref 4.0–10.5)
nRBC: 0 % (ref 0.0–0.2)

## 2019-08-28 LAB — TROPONIN I (HIGH SENSITIVITY)
Troponin I (High Sensitivity): 16 ng/L (ref <18)
Troponin I (High Sensitivity): 18 ng/L — ABNORMAL HIGH (ref <18)

## 2019-08-28 LAB — BASIC METABOLIC PANEL
Anion gap: 12 (ref 5–15)
BUN: 15 mg/dL (ref 8–23)
CO2: 18 mmol/L — ABNORMAL LOW (ref 22–32)
Calcium: 8.3 mg/dL — ABNORMAL LOW (ref 8.9–10.3)
Chloride: 99 mmol/L (ref 98–111)
Creatinine, Ser: 1.24 mg/dL (ref 0.61–1.24)
GFR calc Af Amer: >60 mL/min (ref >60)
GFR calc non Af Amer: >60 mL/min (ref >60)
Glucose, Bld: 136 mg/dL — ABNORMAL HIGH (ref 70–99)
Potassium: 3.9 mmol/L (ref 3.5–5.1)
Sodium: 129 mmol/L — ABNORMAL LOW (ref 135–145)

## 2019-08-28 LAB — SARS CORONAVIRUS 2 BY RT PCR (HOSPITAL ORDER, PERFORMED IN ~~LOC~~ HOSPITAL LAB): SARS Coronavirus 2: NEGATIVE

## 2019-08-28 LAB — LACTIC ACID, PLASMA
Lactic Acid, Venous: 1.4 mmol/L (ref 0.5–1.9)
Lactic Acid, Venous: 1.1 mmol/L (ref 0.5–1.9)

## 2019-08-28 LAB — POC SARS CORONAVIRUS 2 AG -  ED: SARS Coronavirus 2 Ag: NEGATIVE

## 2019-08-28 LAB — PROTIME-INR
INR: 1.1 (ref 0.8–1.2)
Prothrombin Time: 14 seconds (ref 11.4–15.2)

## 2019-08-28 MED ORDER — SODIUM CHLORIDE 0.9 % IV SOLN
500.0000 mg | Freq: Once | INTRAVENOUS | Status: AC
  Administered 2019-08-28: 500 mg via INTRAVENOUS
  Filled 2019-08-28: qty 500

## 2019-08-28 MED ORDER — AMOXICILLIN 500 MG PO CAPS
1000.0000 mg | ORAL_CAPSULE | Freq: Three times a day (TID) | ORAL | 0 refills | Status: AC
Start: 2019-08-28 — End: 2019-09-04

## 2019-08-28 MED ORDER — BENZONATATE 100 MG PO CAPS
100.0000 mg | ORAL_CAPSULE | Freq: Three times a day (TID) | ORAL | 0 refills | Status: DC | PRN
Start: 2019-08-28 — End: 2019-12-26

## 2019-08-28 MED ORDER — AZITHROMYCIN 250 MG PO TABS
250.0000 mg | ORAL_TABLET | Freq: Every day | ORAL | 0 refills | Status: DC
Start: 2019-08-28 — End: 2019-12-26

## 2019-08-28 MED ORDER — SODIUM CHLORIDE 0.9 % IV SOLN
1.0000 g | Freq: Once | INTRAVENOUS | Status: AC
  Administered 2019-08-28: 1 g via INTRAVENOUS
  Filled 2019-08-28: qty 10

## 2019-08-28 MED ORDER — SODIUM CHLORIDE 0.9 % IV BOLUS
1000.0000 mL | Freq: Once | INTRAVENOUS | Status: AC
  Administered 2019-08-28: 1000 mL via INTRAVENOUS

## 2019-08-28 MED ORDER — SODIUM CHLORIDE 0.9 % IV BOLUS: 1000.0000 mL | Freq: Once | INTRAVENOUS | Status: DC

## 2019-08-28 MED ORDER — ACETAMINOPHEN 325 MG PO TABS
650.0000 mg | ORAL_TABLET | Freq: Once | ORAL | Status: AC | PRN
  Administered 2019-08-28: 650 mg via ORAL
  Filled 2019-08-28: qty 2

## 2019-08-28 MED ORDER — ACETAMINOPHEN 500 MG PO TABS
1000.0000 mg | ORAL_TABLET | Freq: Once | ORAL | Status: AC
  Administered 2019-08-28: 1000 mg via ORAL
  Filled 2019-08-28: qty 2

## 2019-08-28 MED ORDER — SODIUM CHLORIDE 0.9% FLUSH
3.0000 mL | Freq: Once | INTRAVENOUS | Status: AC
  Administered 2019-08-28: 3 mL via INTRAVENOUS

## 2019-08-28 NOTE — ED Notes (Addendum)
Pt ambulated, oxygen saturations dropped to 91/92 but quickly increased to 95/96. RN will continue to monitor

## 2019-08-28 NOTE — ED Provider Notes (Signed)
[facility] EMERGENCY DEPARTMENT Provider Note   CSN: [id number] Arrival date & time: 08/28/19  1649     History Chief Complaint  Patient presents with  . Chest Pain  . Fever    Bj Morlock is a 64 y.o. male with history of CAD, dyslipidemia, STEMI, hypertension, nephrolithiasis, metabolic syndrome, obesity presents for evaluation of acute onset, persistent fevers for 5 days.  Reports maximum temperature 103 F, well controlled with Goody's powder and NyQuil.  Has also noted nonproductive cough as well as "tension type" headaches that have progressively improved since his symptoms began.  Denies sore throat, nasal congestion, abdominal pain, nausea, vomiting, urinary symptoms, diarrhea or constipation.  He does report trying to stay hydrated but thinks that his urine is a little darker than usual.  Earlier today approximately 4 hours ago while coughing he developed focal left-sided chest pains just lateral to the sternum.  Reports the pain was constant, dull and lasted for 30 minutes before resolving on its own.  He took a Goody's powder when the symptoms began as well as 1 sublingual nitroglycerin with no change in his symptoms.  Denies any associated symptoms with the chest pain.  The pain worsened with taking deep breaths and he felt as though his chest was spasming.  The pain has not recurred since it resolved.  Smokes cigars occasionally.  No known Covid exposures.  He is not vaccinated against Covid.  He denies any leg swelling, recent travel or surgeries, hemoptysis or prior history of DVT or PE.  He is not on hormone replacement therapy.  The history is provided by the patient.       Past Medical History:  Diagnosis Date  . CAD S/P percutaneous coronary angioplasty 07/14/2011   Inferior STEMI 07/2011 - Promus Element DES 3 x 16 (3.25 mm) for 100% ost RCA, residual ~60-80% p-m LAD disease noted -> post MI Myoview with Anterior Ischemia --> (09/07/2011) Staged PCI of the  LAD - Promus Element DES 4.0x30mm (4.61mm) --> stents patent of Cath 07/14/2017  . Chronic bronchitis (HCC)    "used to get it every year til a few years ago" (09/07/11)  . Dyslipidemia, goal LDL below 70 07/15/2011  . H/O echocardiogram 07/15/2011   Normal EF 55-60%; no WMA  . History of pneumonia 2003  . History of ST elevation myocardial infarction (STEMI) of inferior wall 07/15/2011   Culprit lesion 100% ostRCA: PCI of ost-prox RCA; noted p-m LAD ~70% (planned OP eval - Myoview --> staged PCI)  . Hypertension   . Kidney stones   . Metabolic syndrome    Hgb AiC 6.1 -- low HDL, elevated blood glucose, hypertension  . Obesity (BMI 30-39.9)   . Syncope and collapse April 2014   Post-micturition with orthostatic tendencies.    Patient Active Problem List   Diagnosis Date Noted  . Abnormal ECG   . Encounter for long-term (current) use of other medications 12/03/2012  . ST elevation myocardial infarction (STEMI) of inferior wall -  PCI to RCA with staged PCI to LAD lesion 11/28/2012  . Essential hypertension 11/28/2012  . Syncope and collapse 06/06/2012  . CAD S/P -PCI: PCI RCA during STEMI, staged PCI to LAD residual lesion (Abn Nuc ST) 07/16/2011    Class: Diagnosis of  . Family history of early CAD 07/16/2011  . Obesity (BMI 30-39.9) 07/16/2011  . Metabolic syndrome, obese, elevated glucose, Hgb A1c 6.1 07/16/2011  . Hyperlipidemia with target LDL less than 70 07/15/2011  Past Surgical History:  Procedure Laterality Date  . ANAL SPHINCTEROTOMY  1996  . KNEE ARTHROSCOPY  1999; 9/20099   r/t left knee meniscal tear  . LEFT HEART CATH AND CORONARY ANGIOGRAPHY N/A 07/14/2017   Procedure: LEFT HEART CATH AND CORONARY ANGIOGRAPHY;  Surgeon: [name], MD;  Location: [facility] INVASIVE CV LAB; ; Ostial-proximal RCA stent patent.  Proximal-mid LAD stent patent.  Normal EF 55-60%.  Marland Kitchen LEFT HEART CATHETERIZATION WITH CORONARY ANGIOGRAM N/A 07/14/2011   Procedure: LEFT HEART CATHETERIZATION  WITH CORONARY ANGIOGRAM;  Surgeon: [name], MD;  Location: [facility] CATH LAB;; Inf STEMI: 100% pRCA (PCI) -> 30% mid - RPDA & RPL1, 80% pLDA-60% (@ D1 that is med caliber bifurcating vessel ~20%), patent large RI & non-dom Cx-Lat OM.  Marland Kitchen LEFT HEART CATHETERIZATION WITH CORONARY ANGIOGRAM N/A 09/07/2011   Procedure: LEFT HEART CATHETERIZATION WITH CORONARY ANGIOGRAM;  Surgeon: [name], MD;  Location: [facility] CATH LAB;; widely patent RCA stent with 30% mid disease.  Persistent LAD roughly 80%, 60% lesions surrounding trifurcation into the large D1 and S/P 1 ([place] 1,0,1-with minimal involvement of the diagonal branch.)  FFR highly positive.  Widely patent RI  & Cx-LatOM  . LITHOTRIPSY  1999  . PERCUTANEOUS CORONARY STENT INTERVENTION (PCI-S)  07/14/2011   Procedure: PERCUTANEOUS CORONARY STENT INTERVENTION (PCI-S);  Surgeon: [name], MD;  Location: [facility] CATH LAB;;; INF STEMI --> PCI of 100% RCA with Promus Element 3.0 mm x 16 mm DES; final diameter 3.25 mm  . PERCUTANEOUS CORONARY STENT INTERVENTION (PCI-S) Right 09/07/2011   Procedure: PERCUTANEOUS CORONARY STENT INTERVENTION (PCI-S);  Surgeon: [name], MD;  Location: [facility] CATH LAB;; Staged PCI of  p-mLAD with Promus Element DES 4.0x71mm postdilated to 4.89mm (Abn Myoview & highly + FFR)  . TONSILLECTOMY  1968  . TRANSTHORACIC ECHOCARDIOGRAM  07/14/2017   EF 55 to 60%.  No R WMA.  GR 1 DD.  Mild LA dilation.  Otherwise normal       Family History  Problem Relation Age of Onset  . Coronary artery disease Father 50       MI at 51y/o - died  . Hyperlipidemia Mother   . Heart Problems Maternal Grandmother   . Pneumonia Maternal Grandfather     Social History   Tobacco Use  . Smoking status: Never Smoker  . Smokeless tobacco: Former Neurosurgeon    Types: Chew  Substance Use Topics  . Alcohol use: Yes    Alcohol/week: 7.0 standard drinks    Types: 7 Glasses of wine per week    Comment: 11/28/2012-occasional glass of wine, 09/07/11 "nothing  since MI 07/14/11"  . Drug use: No    Home Medications Prior to Admission medications   Medication Sig Start Date End Date Taking? Authorizing Provider  ALPRAZolam (XANAX) 0.25 MG tablet Take 0.25 mg by mouth at bedtime.    Yes [provider]  aspirin EC 81 MG EC tablet Take 1 tablet (81 mg total) by mouth daily. 07/15/17  Yes Leone Brand, NP  Aspirin-Acetaminophen-Caffeine (GOODY HEADACHE PO) Take 1 packet by mouth as needed (for pain/discomfort or headaches).    Yes [provider]  cetirizine (ZYRTEC) 10 MG tablet Take 10 mg by mouth daily.   Yes [provider]  clopidogrel (PLAVIX) 75 MG tablet TAKE 1 TABLET BY MOUTH EVERY DAY Patient taking differently: Take 75 mg by mouth daily.  08/06/19  Yes [name], MD  cyproheptadine (PERIACTIN) 4 MG tablet Take  4 mg by mouth daily. 04/29/16  Yes [provider]  fenofibrate 54 MG tablet Take 1 tablet (54 mg total) by mouth daily. Patient taking differently: Take 54 mg by mouth at bedtime.  01/24/19  Yes Leonie Man, MD  FLUoxetine (PROZAC) 20 MG capsule Take 20 mg by mouth daily. 07/04/19  Yes [provider]  LIVALO 4 MG TABS TAKE 1 TABLET (4 MG TOTAL) BY MOUTH EVERY EVENING. Patient taking differently: Take 4 mg by mouth daily.  12/08/18  Yes Leonie Man, MD  metoprolol succinate (TOPROL-XL) 50 MG 24 hr tablet Take 1 tablet (50 mg total) by mouth daily. Take with or immediately following a meal. 05/29/19 08/28/19 Yes Leonie Man, MD  nitroGLYCERIN (NITROSTAT) 0.4 MG SL tablet Place 1 tablet (0.4 mg total) under the tongue every 5 (five) minutes as needed for chest pain. 04/26/16  Yes Leonie Man, MD  Pseudoeph-Doxylamine-DM-APAP (NYQUIL MULTI-SYMPTOM PO) Take 15-30 mLs by mouth at bedtime as needed (for flu-like symptoms).   Yes [provider]  amoxicillin (AMOXIL) 500 MG capsule Take 2 capsules (1,000 mg total) by mouth 3 (three) times daily for 7 days. 08/28/19 09/04/19   Rodell Perna A, PA-C  azithromycin (ZITHROMAX) 250 MG tablet Take 1 tablet (250 mg total) by mouth daily. Take first 2 tablets together, then 1 every day until finished. 08/28/19   Emari Demmer A, PA-C  benzonatate (TESSALON) 100 MG capsule Take 1 capsule (100 mg total) by mouth 3 (three) times daily as needed for cough. 08/28/19   Rodell Perna A, PA-C    Allergies    Atorvastatin, Ezetimibe, Pravastatin, and Rosuvastatin  Review of Systems   Review of Systems  Constitutional: Positive for chills and fever.  Respiratory: Positive for cough. Negative for shortness of breath.   Cardiovascular: Positive for chest pain. Negative for leg swelling.  Gastrointestinal: Negative for abdominal pain, diarrhea, nausea and vomiting.  Genitourinary: Negative for dysuria, frequency, hematuria and urgency.  Neurological: Positive for headaches (improving). Negative for weakness and numbness.  All other systems reviewed and are negative.   Physical Exam Updated Vital Signs BP 119/82   Pulse 92   Temp (!) 102.3 F (39.1 C) (Oral)   Resp (!) 32   Ht 6\' 2"  (1.88 m)   Wt 108.9 kg   SpO2 92%   BMI 30.81 kg/m   Physical Exam Vitals and nursing note reviewed.  Constitutional:      General: He is not in acute distress.    Appearance: He is well-developed.     Comments: Resting comfortably in bed  HENT:     Head: Normocephalic and atraumatic.  Eyes:     General:        Right eye: No discharge.        Left eye: No discharge.     Conjunctiva/sclera: Conjunctivae normal.  Neck:     Vascular: No JVD.     Trachea: No tracheal deviation.  Cardiovascular:     Rate and Rhythm: Normal rate and regular rhythm.     Pulses:          Radial pulses are 2+ on the right side and 2+ on the left side.       Dorsalis pedis pulses are 2+ on the right side and 2+ on the left side.       Posterior tibial pulses are 2+ on the right side and 2+ on the left side.     Heart sounds: Normal heart  sounds. No murmur  heard.  No friction rub.     Comments: Homans sign absent bilaterally, no lower extremity edema, no palpable cords, compartments are soft  Pulmonary:     Effort: Pulmonary effort is normal.     Comments: Speaking in full sentences without difficulty, SPO2 saturations 96% on room air Chest:     Chest wall: Tenderness present.       Comments: Focal left parasternal chest wall tenderness with no deformity, crepitus, ecchymosis or flail segment. Abdominal:     General: There is no distension.     Palpations: Abdomen is soft.     Tenderness: There is no abdominal tenderness.  Musculoskeletal:     Cervical back: Normal range of motion and neck supple.  Skin:    General: Skin is warm and dry.     Findings: No erythema.  Neurological:     Mental Status: He is alert.  Psychiatric:        Behavior: Behavior normal.     ED Results / Procedures / Treatments   Labs (all labs ordered are listed, but only abnormal results are displayed) Labs Reviewed  BASIC METABOLIC PANEL - Abnormal; Notable for the following components:      Result Value   Sodium 129 (*)    CO2 18 (*)    Glucose, Bld 136 (*)    Calcium 8.3 (*)    All other components within normal limits  CBC - Abnormal; Notable for the following components:   Hemoglobin 12.8 (*)    HCT 38.7 (*)    All other components within normal limits  URINALYSIS, ROUTINE W REFLEX MICROSCOPIC - Abnormal; Notable for the following components:   Color, Urine AMBER (*)    APPearance CLOUDY (*)    Hgb urine dipstick SMALL (*)    Protein, ur 100 (*)    All other components within normal limits  TROPONIN I (HIGH SENSITIVITY) - Abnormal; Notable for the following components:   Troponin I (High Sensitivity) 18 (*)    All other components within normal limits  SARS CORONAVIRUS 2 BY RT PCR (HOSPITAL ORDER, PERFORMED IN [facility])  CULTURE, BLOOD (ROUTINE X 2)  CULTURE, BLOOD (ROUTINE X 2)  URINE CULTURE  LACTIC ACID, PLASMA    LACTIC ACID, PLASMA  PROTIME-INR  POC SARS CORONAVIRUS 2 AG -  ED  TROPONIN I (HIGH SENSITIVITY)    EKG EKG Interpretation  Date/Time:  Tuesday August 28 2019 17:39:49 EDT Ventricular Rate:  115 PR Interval:  134 QRS Duration: 98 QT Interval:  338 QTC Calculation: 467 R Axis:   -8 Text Interpretation: Sinus tachycardia Inferior infarct , age undetermined ST & T wave abnormality, consider anterior ischemia Abnormal ECG Since last tracing rate faster Confirmed by Richardean Canal [phone]) on 08/28/2019 8:00:22 PM   Radiology DG Chest 2 View  Result Date: 08/28/2019 CLINICAL DATA:  64 year old male with chest pain. EXAM: CHEST - 2 VIEW COMPARISON:  Chest radiograph dated 07/13/2017. FINDINGS: Large area of airspace opacity involving the left upper lobe most consistent with pneumonia. Clinical correlation and follow-up to resolution recommended. No pleural effusion or pneumothorax. Top-normal cardiac size. No acute osseous pathology. IMPRESSION: Left upper lobe pneumonia.  Follow-up to resolution recommended. Electronically Signed   By: Elgie Collard M.D.   On: 08/28/2019 18:49    Procedures Procedures (including critical care time)  Medications Ordered in ED Medications  acetaminophen (TYLENOL) tablet 1,000 mg (has no administration in time range)  sodium chloride  flush (NS) 0.9 % injection 3 mL (3 mLs Intravenous Given 08/28/19 2105)  acetaminophen (TYLENOL) tablet 650 mg (650 mg Oral Given 08/28/19 1742)  sodium chloride 0.9 % bolus 1,000 mL (0 mLs Intravenous Stopped 08/28/19 2256)  cefTRIAXone (ROCEPHIN) 1 g in sodium chloride 0.9 % 100 mL IVPB (0 g Intravenous Stopped 08/28/19 2139)  azithromycin (ZITHROMAX) 500 mg in sodium chloride 0.9 % 250 mL IVPB (0 mg Intravenous Stopped 08/28/19 2256)    ED Course  I have reviewed the triage vital signs and the nursing notes.  Pertinent labs & imaging results that were available during my care of the patient were reviewed by me and  considered in my medical decision making (see chart for details).    MDM Rules/Calculators/A&P                          Jason Mathis was evaluated in Emergency Department on 08/28/2019 for the symptoms described in the history of present illness. He was evaluated in the context of the global COVID-19 pandemic, which necessitated consideration that the patient might be at risk for infection with the SARS-CoV-2 virus that causes COVID-19. Institutional protocols and algorithms that pertain to the evaluation of patients at risk for COVID-19 are in a state of rapid change based on information released by regulatory bodies including the CDC and federal and state organizations. These policies and algorithms were followed during the patient's care in the ED.   Patient presenting for evaluation of transient left-sided chest pains today while coughing.  He has had a fever for 3 days with associated nonproductive cough.  He has felt generally weak, intermittently winded.  Has been trying to stay hydrated.  Clinically appears nontoxic.  He is febrile in the ED, initially tachycardic with improvement after administration of antipyretic.  SPO2 saturations are stable in the ED.  He was ambulated in the ED with saturations briefly dropping to 91/92% but recovered quickly.  Does not appear to be in any respiratory distress.  Chest x-ray today significant for what appears to be left upper lobe pneumonia.  Remainder of lab work reviewed and interpreted by myself shows no leukocytosis, mild anemia no renal insufficiency.  He is mildly hyponatremic, CO2 is a little low likely in the setting of dehydration.  UA does not suggest UTI or nephrolithiasis.  His EKG shows sinus tachycardia, nonspecific T wave abnormalities noted but his troponins are stable. Second troponin mildly elevated, though not significantly different from the last to suggest ACS/MI. Could be in the setting of demand ischemia due to dehydration/infection.  He is  low risk for PE and his symptoms sound more infectious in etiology with fever and cough.  His chest pain is reproducible on palpation of a very focal area involving the left parasternal region.  This suggest musculoskeletal etiology of his pain likely in the setting of persistent cough.  Remainder of work-up is reassuring.  He does not appear overtly septic.  Doubt dissection, cardiac tamponade, esophageal rupture, or CHF.  Patient received IV fluids in the ED.  On reevaluation he is resting comfortably in no distress.  His PCR Covid test is negative in the ED.  Recommend close follow-up with PCP on an outpatient basis and to ensure resolution of pneumonia.  He will follow up with his cardiologist for reevaluation of chest pains.  Discussed strict ED return precautions.  Patient and wife verbalized understanding of and agreement with plan and patient is stable for  discharge at this time. Discussed with Dr. Silverio Lay who agrees with assessment and plan at this time.       Final Clinical Impression(s) / ED Diagnoses Final diagnoses:  Community acquired pneumonia of left upper lobe of lung  Atypical chest pain  Chest wall pain    Rx / DC Orders ED Discharge Orders         Ordered    amoxicillin (AMOXIL) 500 MG capsule  3 times daily     Discontinue  Reprint     08/28/19 2254    azithromycin (ZITHROMAX) 250 MG tablet  Daily     Discontinue  Reprint     08/28/19 2254    benzonatate (TESSALON) 100 MG capsule  3 times daily PRN     Discontinue  Reprint     08/28/19 2254           Jeanie Sewer, PA-C 08/28/19 2310    Charlynne Pander, MD 08/28/19 [id number]

## 2019-08-28 NOTE — Discharge Instructions (Signed)
Your Covid test today was negative.  Your x-ray showed evidence of pneumonia.  We will treat this with antibiotics.  Please take all of your antibiotics until finished!   Take your antibiotics with food.  Common side effects of antibiotics include nausea, vomiting, abdominal discomfort, and diarrhea. You may help offset some of this with probiotics which you can buy or get in yogurt. Do not eat  or take the probiotics until 2 hours after your antibiotic.    Take Tessalon Perles as needed for cough.  You can also use medication such as Mucinex and over-the-counter allergy medicine such as Zyrtec or Allegra.  Avoid medications that include dextromethorphan and pseudoephedrine.  You can take extra strength Tylenol every 6 hours as needed for aches pains and fevers.  Do not exceed more than 4000 mg of Tylenol daily.  Can apply heat or ice to the chest wall, whichever feels best, 20 minutes at a time a few times daily.  You can also apply topical creams such as icy hot, Salonpas, lidocaine to areas of pain.  Drink plenty of fluids and get rest.  Follow-up with PCP for reevaluation of symptoms.  You can also follow-up with cardiology on an outpatient basis if symptoms persist.  Return to the emergency department if any concerning signs or symptoms develop such as fevers, worsening shortness of breath or chest pain, low oxygen saturations less than 92%, persistent vomiting, loss of consciousness.

## 2019-08-28 NOTE — ED Notes (Signed)
Pt resting , attached to cardiac monitor, pulse ox and bp cuff, pt denies any needs at this time, RN will continue  To monitor

## 2019-08-28 NOTE — ED Triage Notes (Signed)
Patient arrives to the ED with complaints of centralized chest pain starting today. Patient states hes been "sick" since Friday with complaints of fever, chills, and headache. Patient states hes been drinking plenty of fluids but urine is still dark.  Denies SOB.

## 2019-08-28 NOTE — ED Notes (Signed)
Pt able to void and provide urine sample

## 2019-08-29 ENCOUNTER — Ambulatory Visit: Payer: BC Managed Care – PPO | Attending: Internal Medicine

## 2019-08-30 LAB — URINE CULTURE: Culture: NO GROWTH

## 2019-09-02 LAB — CULTURE, BLOOD (ROUTINE X 2)
Culture: NO GROWTH
Culture: NO GROWTH

## 2019-09-05 DIAGNOSIS — J189 Pneumonia, unspecified organism: Secondary | ICD-10-CM | POA: Diagnosis not present

## 2019-09-12 ENCOUNTER — Other Ambulatory Visit: Payer: Self-pay | Admitting: Cardiology

## 2019-10-02 DIAGNOSIS — J189 Pneumonia, unspecified organism: Secondary | ICD-10-CM | POA: Diagnosis not present

## 2019-10-09 DIAGNOSIS — R05 Cough: Secondary | ICD-10-CM | POA: Diagnosis not present

## 2019-12-26 ENCOUNTER — Encounter: Payer: Self-pay | Admitting: Cardiology

## 2019-12-26 ENCOUNTER — Ambulatory Visit: Payer: BC Managed Care – PPO | Admitting: Cardiology

## 2019-12-26 VITALS — BP 122/86 | HR 79 | Ht 74.0 in | Wt 262.0 lb

## 2019-12-26 DIAGNOSIS — E669 Obesity, unspecified: Secondary | ICD-10-CM

## 2019-12-26 DIAGNOSIS — I10 Essential (primary) hypertension: Secondary | ICD-10-CM

## 2019-12-26 DIAGNOSIS — I2510 Atherosclerotic heart disease of native coronary artery without angina pectoris: Secondary | ICD-10-CM | POA: Diagnosis not present

## 2019-12-26 DIAGNOSIS — Z9861 Coronary angioplasty status: Secondary | ICD-10-CM

## 2019-12-26 DIAGNOSIS — E785 Hyperlipidemia, unspecified: Secondary | ICD-10-CM | POA: Diagnosis not present

## 2019-12-26 DIAGNOSIS — I2119 ST elevation (STEMI) myocardial infarction involving other coronary artery of inferior wall: Secondary | ICD-10-CM

## 2019-12-26 NOTE — Assessment & Plan Note (Signed)
We talked about his need to adjust his diet and get back into exercising.  His goal is to try to lose back to 20 pounds he has recently gained.

## 2019-12-26 NOTE — Assessment & Plan Note (Signed)
8-1/2 years out from his MI with staged PCI.  Had a cath in 2019 showing patent stents.  Has not had any further angina.  He only issue is that he has gained weight and therefore lipids have not been as well controlled on Livalo plus fibrate.  Plan:   Continue maintenance dose Plavix but okay to stop aspirin.  Okay to hold Plavix 5 days preop for any procedures or surgeries.  If possible, would like to stay on aspirin and not hold for procedures.    (I have given him the option to quit aspirin, if he is not taking, would want him to potentially use aspirin in lieu of Plavix for procedures)  Continue current dose of Toprol.  I do not suspect that we need to increase any further.  Continue low-dose  Continue Livalo and fenofibrate, however need to be more aggressive with lipids.

## 2019-12-26 NOTE — Assessment & Plan Note (Signed)
He is doing okay with Livalo and 5 g, but his lipids have not really been at goal.  We will recheck them today.  Low threshold to refer him to CVRR to consider Repatha or Praluent.

## 2019-12-26 NOTE — Assessment & Plan Note (Signed)
Well-controlled on current dose of Toprol.  We did increase him to 50 and then he got little orthostatic.Jason Mathis back to 25 mg.

## 2019-12-26 NOTE — Assessment & Plan Note (Signed)
We are now 8-1/2 years out from his MI.  He is doing well.  Other than the episode in 2019 when he had a relook cath, he has had no major issues from chest pain or dyspnea standpoint.  On stable regimen.  Preserved EF on echo.

## 2019-12-26 NOTE — Patient Instructions (Signed)
Medication Instructions:  No changes *If you need a refill on your cardiac medications before your next appointment, please call your pharmacy*   Lab Work: Fasting lipid cmp  If you have labs (blood work) drawn today and your tests are completely normal, you will receive your results only by: Marland Kitchen MyChart Message (if you have MyChart) OR . A paper copy in the mail If you have any lab test that is abnormal or we need to change your treatment, we will call you to review the results.   Testing/Procedures: Not needed   Follow-Up: At [facility], you and your health needs are our priority.  As part of our continuing mission to provide you with exceptional heart care, we have created designated Provider Care Teams.  These Care Teams include your primary Cardiologist (physician) and Advanced Practice Providers (APPs -  Physician Assistants and Nurse Practitioners) who all work together to provide you with the care you need, when you need it.    Your next appointment:   12 month(s)  The format for your next appointment:   In Person  Provider:   Bryan Lemma, MD

## 2019-12-26 NOTE — Progress Notes (Signed)
Primary Care Provider: [name], MD Cardiologist: Bryan Lemma, MD Electrophysiologist: None  Clinic Note: Chief Complaint  Patient presents with  . Follow-up    Annual.  No complaints.  Has gained weight.  . Coronary Artery Disease    No angina  . Hyperlipidemia    Due for labs to be checked  . Hypertension     HPI:    Jason Mathis is a 64 y.o. male with a PMH below who presents today for History of Inferior MI/CAD (initial PCI to occluded RCA following staged LAD PCI after abnormal Myoview to evaluate questionable LAD lesion) along with CRFs of HTN and HLD plus obesity who presents for annual follow-up.  CARDIAC HISTORY  Inferior STEMI in May 2013 with occluded RCA.  ? He then had Staged PCI-LAD for abnormal Myoview post MI. ? Cardiac cath in May 2019 showed patent RCA stents and patent LAD stent with normal EF.  Normal echo.  He had memory issues with multiple different statins, and is finally now on Livalo, and doing well   Jason Mathis was last seen on December 06, 2018.  He is doing very well.  He noted that he had not really been able to get out do his routine amount of kayaking because of a recent pulled muscle issue.  He was therefore getting a little bit of weight.  Unfortunately he never really did get back into doing his exercise and Covid kept him from doing gym exercises.  Recent Hospitalizations:   August 28, 2019 -> most emergency room visit.  Diagnosed with pneumonia (left upper lobe).  Symptoms began with fever of 103 lasting for maybe 5 days off and on it was then associated with a headache and cough did seem like a cold.  Then finally he started having some pain in the left upper chest.  It is constant dull pain last about 30 minutes and resolved on its own.  He got better when he took some Goody's powder, not with nitroglycerin.  Was noted to have mildly reduced oxygen levels in the 91-92% range while walking.  Was not in any distress.  Chest pain was  reproducible. ->  Discharged on amoxicillin and azithromycin along with Tessalon Perles.  Ruled out for Covid.   Reviewed  CV studies:    The following studies were reviewed today: (if available, images/films reviewed: From Epic Chart or Care Everywhere) . none:   Interval History:   Jason Mathis returns today overall doing well from a cardiac standpoint.  He bemoans the fact that he had lost all the way down to 220 pounds and then it came back up to 242pounds.  He got out of the habit of doing his exercises again, and has yet to get back into it since his pneumonia.  At this point he realizes that he probably needs come up with a different exercise regimen besides the kayaking.  However he does walk some, and tries to stay active.  He has not had any symptoms whatsoever of chest pain or pressure with rest or exertion.  No heart failure symptoms and no palpitations. He really does not have any more residual symptoms from the pneumonia.  None more than left-sided upper chest pain, no cough no more fevers or chills.  He was very happy to have ruled out for Covid.  He says that he had a trip not that long ago to visit his son.  After long time of the car, he had some  stiffness mostly with his left leg knee behind the knee.  He also little swelling of the leg.  Since then he has been having intermittent swelling in the left ankle and shin more than he had before.  It does go down with raising his leg.  He does not have any prolonged swelling.  Now is not really bothering him that much.  No redness, or tenderness.  CV Review of Symptoms (Summary): no chest pain or dyspnea on exertion positive for - Weight gain and getting somewhat "lazy ". negative for - edema, irregular heartbeat, orthopnea, palpitations, paroxysmal nocturnal dyspnea, rapid heart rate, shortness of breath or Syncope/near syncope or TIA/amaurosis fugax.  The patient does not have symptoms concerning for COVID-19 infection (fever,  chills, cough, or new shortness of breath).   REVIEWED OF SYSTEMS   Review of Systems  Constitutional: Negative for fever, malaise/fatigue and weight loss (Weight gain).  HENT: Negative for congestion and nosebleeds.   Respiratory: Negative for cough and shortness of breath.        Recovered from pneumonia with no further cough or congestion.  Gastrointestinal: Negative for blood in stool, heartburn and melena.  Genitourinary: Negative for hematuria.  Musculoskeletal: Negative for joint pain and myalgias.  Neurological: Negative for dizziness, weakness and headaches.  Psychiatric/Behavioral: Negative for depression and memory loss (Doing much better on current dose of Livalo). The patient is not nervous/anxious and does not have insomnia.    Weight gain.  Recovering from pneumonia-no cough  I have reviewed and (if needed) personally updated the patient's problem list, medications, allergies, past medical and surgical history, social and family history.   PAST MEDICAL HISTORY   Past Medical History:  Diagnosis Date  . CAD S/P percutaneous coronary angioplasty 07/14/2011   Inferior STEMI 07/2011 - Promus Element DES 3 x 16 (3.25 mm) for 100% ost RCA, residual ~60-80% p-m LAD disease noted -> post MI Myoview with Anterior Ischemia --> (09/07/2011) Staged PCI of the LAD - Promus Element DES 4.0x64mm (4.68mm) --> stents patent of Cath 07/14/2017  . Chronic bronchitis (HCC)    "used to get it every year til a few years ago" (09/07/11)  . Dyslipidemia, goal LDL below 70 07/15/2011  . H/O echocardiogram 07/15/2011   Normal EF 55-60%; no WMA  . History of pneumonia 2003  . History of ST elevation myocardial infarction (STEMI) of inferior wall 07/15/2011   Culprit lesion 100% ostRCA: PCI of ost-prox RCA; noted p-m LAD ~70% (planned OP eval - Myoview --> staged PCI)  . Hypertension   . Kidney stones   . Metabolic syndrome    Hgb AiC 6.1 -- low HDL, elevated blood glucose, hypertension  . Obesity  (BMI 30-39.9)   . Syncope and collapse April 2014   Post-micturition with orthostatic tendencies.    PAST SURGICAL HISTORY   Past Surgical History:  Procedure Laterality Date  . ANAL SPHINCTEROTOMY  1996  . KNEE ARTHROSCOPY  1999; 9/20099   r/t left knee meniscal tear  . LEFT HEART CATH AND CORONARY ANGIOGRAPHY N/A 07/14/2017   Procedure: LEFT HEART CATH AND CORONARY ANGIOGRAPHY;  Surgeon: [name], MD;  Location: [facility] INVASIVE CV LAB; ; Ostial-proximal RCA stent patent.  Proximal-mid LAD stent patent.  Normal EF 55-60%.  Marland Kitchen LEFT HEART CATHETERIZATION WITH CORONARY ANGIOGRAM N/A 07/14/2011   Procedure: LEFT HEART CATHETERIZATION WITH CORONARY ANGIOGRAM;  Surgeon: [name], MD;  Location: [facility] CATH LAB;; Inf STEMI: 100% pRCA (PCI) -> 30% mid - RPDA & RPL1,  80% pLDA-60% (@ D1 that is med caliber bifurcating vessel ~20%), patent large RI & non-dom Cx-Lat OM.  Marland Kitchen LEFT HEART CATHETERIZATION WITH CORONARY ANGIOGRAM N/A 09/07/2011   Procedure: LEFT HEART CATHETERIZATION WITH CORONARY ANGIOGRAM;  Surgeon: [name], MD;  Location: [facility] CATH LAB;; widely patent RCA stent with 30% mid disease.  Persistent LAD roughly 80%, 60% lesions surrounding trifurcation into the large D1 and S/P 1 ([place] 1,0,1-with minimal involvement of the diagonal branch.)  FFR highly positive.  Widely patent RI  & Cx-LatOM  . LITHOTRIPSY  1999  . PERCUTANEOUS CORONARY STENT INTERVENTION (PCI-S)  07/14/2011   Procedure: PERCUTANEOUS CORONARY STENT INTERVENTION (PCI-S);  Surgeon: [name], MD;  Location: [facility] CATH LAB;;; INF STEMI --> PCI of 100% RCA with Promus Element 3.0 mm x 16 mm DES; final diameter 3.25 mm  . PERCUTANEOUS CORONARY STENT INTERVENTION (PCI-S) Right 09/07/2011   Procedure: PERCUTANEOUS CORONARY STENT INTERVENTION (PCI-S);  Surgeon: [name], MD;  Location: [facility] CATH LAB;; Staged PCI of  p-mLAD with Promus Element DES 4.0x80mm postdilated to 4.12mm (Abn Myoview & highly + FFR)  . TONSILLECTOMY   1968  . TRANSTHORACIC ECHOCARDIOGRAM  07/14/2017   EF 55 to 60%.  No R WMA.  GR 1 DD.  Mild LA dilation.  Otherwise normal     There is no immunization history on file for this patient.  MEDICATIONS/ALLERGIES   Current Meds  Medication Sig  . ALPRAZolam (XANAX) 0.25 MG tablet Take 0.25 mg by mouth at bedtime.   Marland Kitchen aspirin EC 81 MG EC tablet Take 1 tablet (81 mg total) by mouth daily.  . Aspirin-Acetaminophen-Caffeine (GOODY HEADACHE PO) Take 1 packet by mouth as needed (for pain/discomfort or headaches).   . cetirizine (ZYRTEC) 10 MG tablet Take 10 mg by mouth daily.  . clopidogrel (PLAVIX) 75 MG tablet TAKE 1 TABLET BY MOUTH EVERY DAY  . cyproheptadine (PERIACTIN) 4 MG tablet Take 4 mg by mouth daily.  . fenofibrate 54 MG tablet Take 1 tablet (54 mg total) by mouth daily. (Patient taking differently: Take 54 mg by mouth at bedtime. )  . FLUoxetine (PROZAC) 20 MG capsule Take 20 mg by mouth daily.  Marland Kitchen LIVALO 4 MG TABS TAKE 1 TABLET (4 MG TOTAL) BY MOUTH EVERY EVENING. (Patient taking differently: Take 4 mg by mouth daily. )  . metoprolol succinate (TOPROL-XL) 25 MG 24 hr tablet TAKE 1 TABLET BY MOUTH EVERY DAY  . nitroGLYCERIN (NITROSTAT) 0.4 MG SL tablet Place 1 tablet (0.4 mg total) under the tongue every 5 (five) minutes as needed for chest pain.    Allergies  Allergen Reactions  . Atorvastatin Other (See Comments)    Memory issues  . Ezetimibe Other (See Comments)    Myalgias  . Pravastatin Other (See Comments)    Memory issues  . Rosuvastatin Other (See Comments)    Memory issues    SOCIAL HISTORY/FAMILY HISTORY   Reviewed in Epic:  Pertinent findings: Retired - but works part time flipping houses.  Needs to get back to exercise.  OBJCTIVE -PE, EKG, labs   Wt Readings from Last 3 Encounters:  12/26/19 262 lb (118.8 kg)  08/28/19 240 lb (108.9 kg)  12/06/18 264 lb 6.4 oz (119.9 kg)    Physical Exam: BP 122/86   Pulse 79   Ht 6\' 2"  (1.88 m)   Wt 262 lb (118.8  kg)   BMI 33.64 kg/m  Physical Exam Vitals reviewed.  Constitutional:  General: He is not in acute distress.    Appearance: Normal appearance. He is obese. He is not ill-appearing.     Comments: Well-groomed.  Good spirits.  He does seem a little bit heavier.  HENT:     Head: Normocephalic and atraumatic.  Neck:     Vascular: No carotid bruit.  Cardiovascular:     Rate and Rhythm: Normal rate and regular rhythm.     Pulses: Normal pulses.     Heart sounds: Normal heart sounds. No murmur heard.  No gallop.   Pulmonary:     Effort: Pulmonary effort is normal. No respiratory distress.     Breath sounds: Normal breath sounds.  Chest:     Chest wall: No tenderness.  Musculoskeletal:        General: No swelling. Normal range of motion.     Cervical back: Normal range of motion and neck supple.  Neurological:     General: No focal deficit present.     Mental Status: He is alert and oriented to person, place, and time.  Psychiatric:        Mood and Affect: Mood normal.        Behavior: Behavior normal.        Thought Content: Thought content normal.        Judgment: Judgment normal.      Adult ECG Report  Rate: 79;  Rhythm: normal sinus rhythm and Normal axis, intervals and durations.  Inferior MI, age undetermined.  Otherwise stable.;   Narrative Interpretation: Stable EKG.  Recent Labs: Due for labs to be checked Lab Results  Component Value Date   CHOL 195 01/15/2019   HDL 39 (L) 01/15/2019   LDLCALC 111 (H) 01/15/2019   TRIG 257 (H) 01/15/2019   CHOLHDL 5.0 01/15/2019   Lab Results  Component Value Date   CREATININE 1.24 08/28/2019   BUN 15 08/28/2019   NA 129 (L) 08/28/2019   K 3.9 08/28/2019   CL 99 08/28/2019   CO2 18 (L) 08/28/2019   Lab Results  Component Value Date   TSH 0.909 07/15/2011    ASSESSMENT/PLAN    Problem List Items Addressed This Visit    CAD S/P -PCI: PCI RCA during STEMI, staged PCI to LAD residual lesion (Abn Nuc ST) - Primary  (Chronic)    8-1/2 years out from his MI with staged PCI.  Had a cath in 2019 showing patent stents.  Has not had any further angina.  He only issue is that he has gained weight and therefore lipids have not been as well controlled on Livalo plus fibrate.  Plan:   Continue maintenance dose Plavix but okay to stop aspirin.  Okay to hold Plavix 5 days preop for any procedures or surgeries.  If possible, would like to stay on aspirin and not hold for procedures.    (I have given him the option to quit aspirin, if he is not taking, would want him to potentially use aspirin in lieu of Plavix for procedures)  Continue current dose of Toprol.  I do not suspect that we need to increase any further.  Continue low-dose  Continue Livalo and fenofibrate, however need to be more aggressive with lipids.      Relevant Orders   EKG 12-Lead   Comprehensive metabolic panel   ST elevation myocardial infarction (STEMI) of inferior wall -  PCI to RCA with staged PCI to LAD lesion (Chronic)    We are now 8-1/2 years out from  his MI.  He is doing well.  Other than the episode in 2019 when he had a relook cath, he has had no major issues from chest pain or dyspnea standpoint.  On stable regimen.  Preserved EF on echo.      Relevant Orders   EKG 12-Lead   Hyperlipidemia with target LDL less than 70 (Chronic)    He is doing okay with Livalo and 5 g, but his lipids have not really been at goal.  We will recheck them today.  Low threshold to refer him to CV[facility] to consider Repatha or Praluent.      Relevant Orders   Lipid panel   Comprehensive metabolic panel   Obesity (BMI 09-32.3) (Chronic)    We talked about his need to adjust his diet and get back into exercising.  His goal is to try to lose back to 20 pounds he has recently gained.      Relevant Orders   Lipid panel   Comprehensive metabolic panel   Essential hypertension (Chronic)    Well-controlled on current dose of Toprol.  We did increase him  to 50 and then he got little orthostatic.Micah Flesher back to 25 mg.         COVID-19 Education: The signs and symptoms of COVID-19 were discussed with the patient and how to seek care for testing (follow up with PCP or arrange E-visit).   The importance of social distancing and COVID-19 vaccination was discussed today. 2 min - > he has his booster scheduled. The patient is practicing social distancing & Masking.   I spent a total of with the patient spent in direct patient consultation.  Additional time spent with chart review  / charting (studies, outside notes, etc): 8 Total Time: 38 min   Current medicines are reviewed at length with the patient today.  (+/- concerns) non  This visit occurred during the SARS-CoV-2 public health emergency.  Safety protocols were in place, including screening questions prior to the visit, additional usage of staff PPE, and extensive cleaning of exam room while observing appropriate contact time as indicated for disinfecting solutions.  Notice: This dictation was prepared with Dragon dictation along with smaller phrase technology. Any transcriptional errors that result from this process are unintentional and may not be corrected upon review.  Patient Instructions / Medication Changes & Studies & Tests Ordered   Patient Instructions  Medication Instructions:  No changes *If you need a refill on your cardiac medications before your next appointment, please call your pharmacy*   Lab Work: Fasting lipid cmp  If you have labs (blood work) drawn today and your tests are completely normal, you will receive your results only by: Marland Kitchen MyChart Message (if you have MyChart) OR . A paper copy in the mail If you have any lab test that is abnormal or we need to change your treatment, we will call you to review the results.   Testing/Procedures: Not needed   Follow-Up: At [facility], you and your health needs are our priority.  As part of our  continuing mission to provide you with exceptional heart care, we have created designated Provider Care Teams.  These Care Teams include your primary Cardiologist (physician) and Advanced Practice Providers (APPs -  Physician Assistants and Nurse Practitioners) who all work together to provide you with the care you need, when you need it.    Your next appointment:   12 month(s)  The format for your next appointment:  In Person  Provider:   Bryan Lemma, MD       Studies Ordered:   Orders Placed This Encounter  Procedures  . Lipid panel  . Comprehensive metabolic panel  . EKG 12-Lead     Bryan Lemma, M.D., M.S. Interventional Cardiologist   Pager # [phone] Phone # [phone] [address] [place], [place] [address]   Thank you for choosing [facility] at [facility]!!

## 2019-12-29 ENCOUNTER — Other Ambulatory Visit: Payer: Self-pay | Admitting: Cardiology

## 2020-01-02 ENCOUNTER — Telehealth: Payer: Self-pay | Admitting: *Deleted

## 2020-01-02 NOTE — Telephone Encounter (Signed)
medication was approved . Notified pharmacy

## 2020-01-02 NOTE — Telephone Encounter (Signed)
KEY; [id number]  STARTED COVERMYMED PRIOR Authorization  LIVALO 4 MG   DX: E78.5, I21.9  PATIENT TRIED ATORVASTATIN 2014, ROUVASTATIN 2014, PRAVASTATIN 2016, FENOFIBRATE 2015 ---STOPPED DUE MEMORY ISSUES ,MYALGIAS  AWAITING OUTCOME.

## 2020-01-23 ENCOUNTER — Other Ambulatory Visit: Payer: Self-pay | Admitting: Cardiology

## 2020-05-22 ENCOUNTER — Other Ambulatory Visit: Payer: Self-pay | Admitting: Cardiology

## 2020-05-23 ENCOUNTER — Other Ambulatory Visit: Payer: Self-pay | Admitting: Cardiology

## 2020-08-12 ENCOUNTER — Other Ambulatory Visit: Payer: Self-pay | Admitting: Cardiology

## 2020-09-02 ENCOUNTER — Telehealth: Payer: Self-pay | Admitting: Cardiology

## 2020-09-02 MED ORDER — METOPROLOL SUCCINATE ER 25 MG PO TB24: 25.0000 mg | ORAL_TABLET | Freq: Every day | ORAL | 1 refills | Status: DC

## 2020-09-02 NOTE — Telephone Encounter (Signed)
°*  STAT* If patient is at the pharmacy, call can be transferred to refill team. ° ° °1. Which medications need to be refilled? (please list name of each medication and dose if known) metoprolol succinate (TOPROL-XL) 25 MG 24 hr tablet ° °2. Which pharmacy/location (including street and city if local pharmacy) is medication to be sent to? [id number] - [place], [place] - [address] ° °3. Do they need a 30 day or 90 day supply? 90 ° °

## 2020-09-08 IMAGING — DX DG CHEST 2V
2 series · 2 of 2 positions shown · non-contrast
Comparison: Chest radiograph dated 07/13/2017.

CLINICAL DATA: 64-year-old male with chest pain.

EXAM:
CHEST - 2 VIEW

[chest pa]
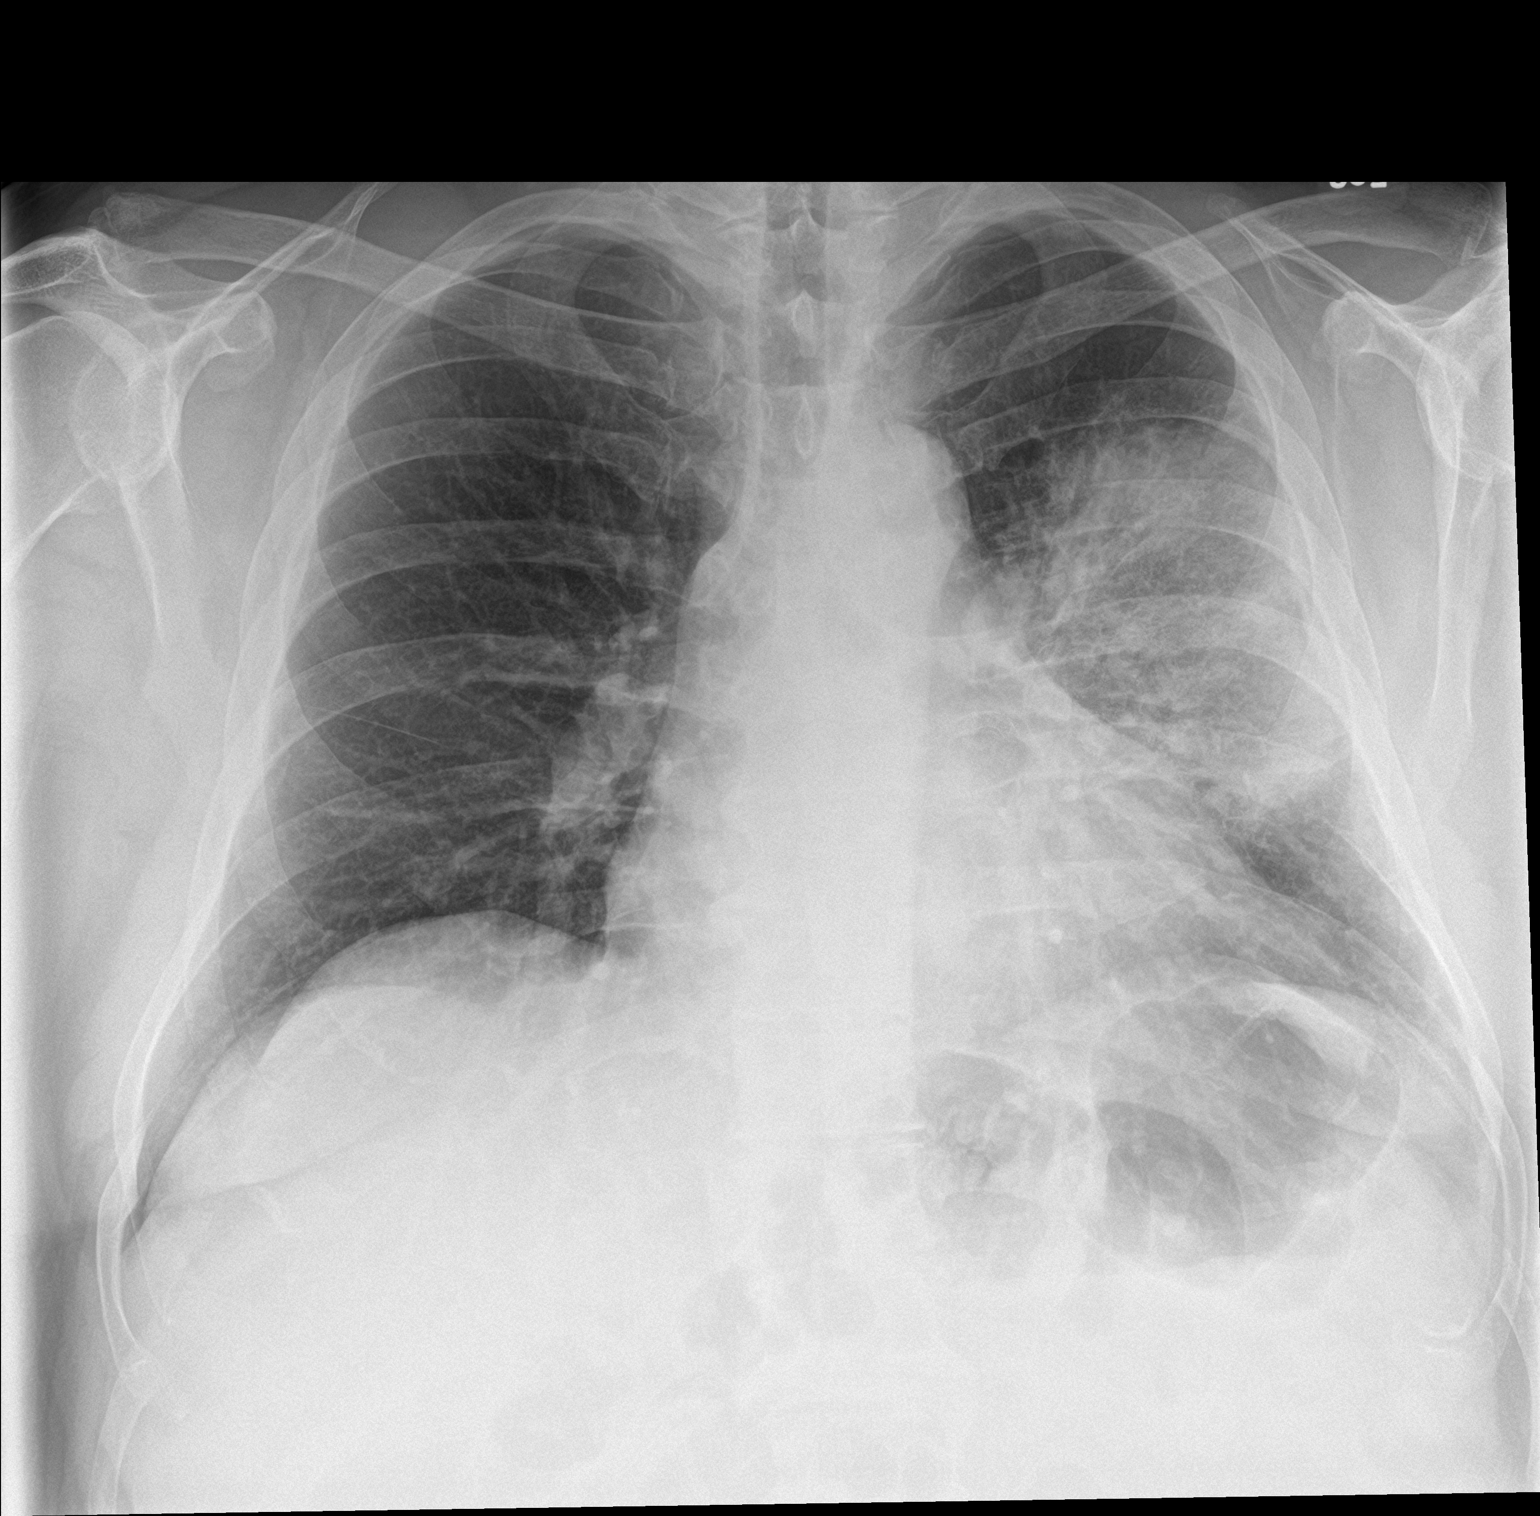

[chest lat]
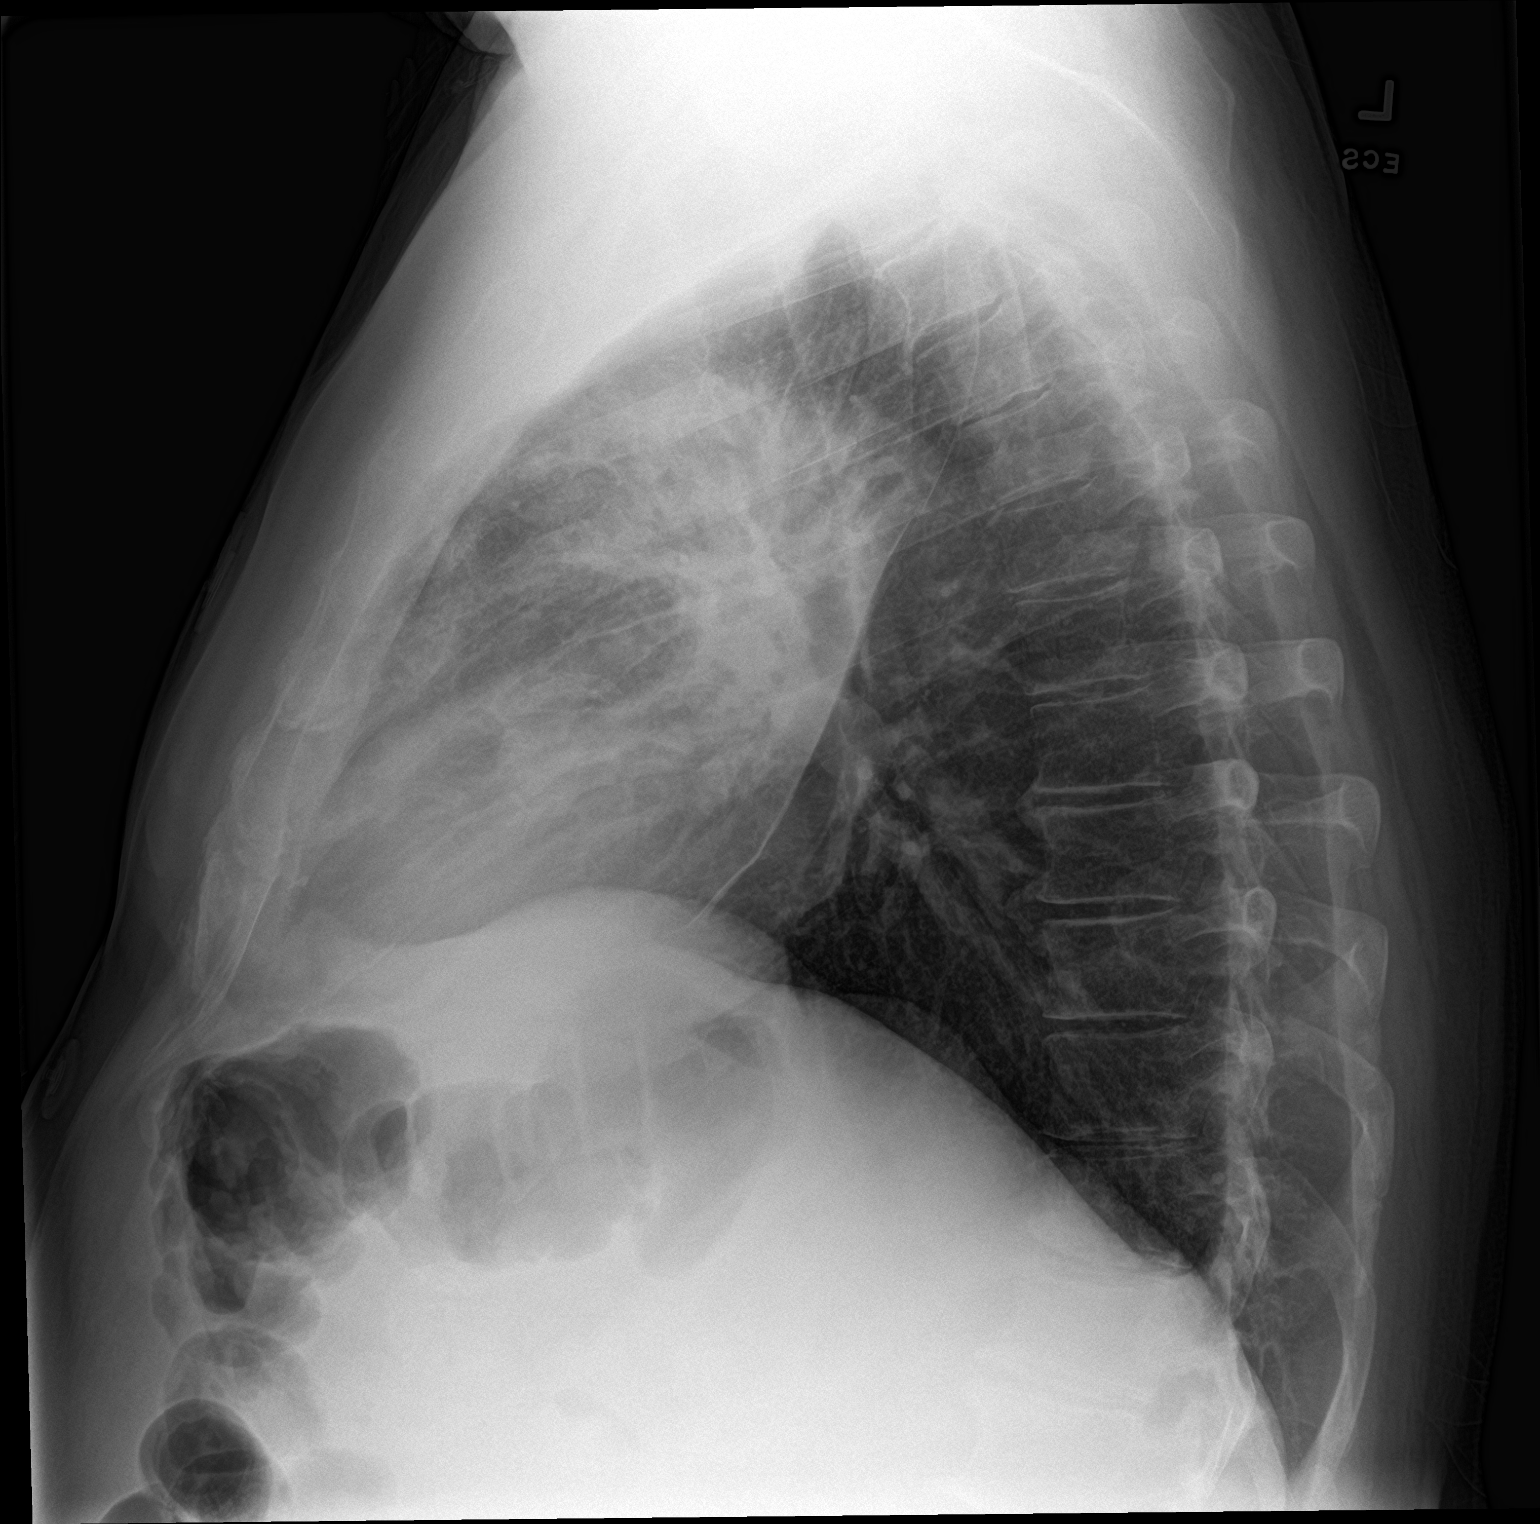

[2 of 2 positions shown; findings below may reference images not displayed]

FINDINGS: Large area of airspace opacity involving the left upper lobe most
consistent with pneumonia. Clinical correlation and follow-up to
resolution recommended. No pleural effusion or pneumothorax.
Top-normal cardiac size. No acute osseous pathology.
IMPRESSION: Left upper lobe pneumonia.  Follow-up to resolution recommended.

## 2020-11-17 ENCOUNTER — Other Ambulatory Visit: Payer: Self-pay | Admitting: Cardiology

## 2020-12-22 ENCOUNTER — Other Ambulatory Visit: Payer: Self-pay | Admitting: Cardiology

## 2021-01-02 ENCOUNTER — Other Ambulatory Visit: Payer: Self-pay | Admitting: Cardiology

## 2021-01-19 ENCOUNTER — Ambulatory Visit: Payer: BC Managed Care – PPO | Admitting: Cardiology

## 2021-01-31 ENCOUNTER — Other Ambulatory Visit: Payer: Self-pay | Admitting: Cardiology

## 2021-02-27 ENCOUNTER — Other Ambulatory Visit: Payer: Self-pay | Admitting: Cardiology

## 2021-03-22 ENCOUNTER — Other Ambulatory Visit: Payer: Self-pay | Admitting: Cardiology

## 2021-04-10 ENCOUNTER — Other Ambulatory Visit: Payer: Self-pay

## 2021-04-10 ENCOUNTER — Ambulatory Visit (INDEPENDENT_AMBULATORY_CARE_PROVIDER_SITE_OTHER): Payer: Medicare Other | Admitting: Cardiology

## 2021-04-10 ENCOUNTER — Encounter: Payer: Self-pay | Admitting: Cardiology

## 2021-04-10 VITALS — BP 136/94 | HR 74 | Ht 74.0 in | Wt 251.0 lb

## 2021-04-10 DIAGNOSIS — I2510 Atherosclerotic heart disease of native coronary artery without angina pectoris: Secondary | ICD-10-CM

## 2021-04-10 DIAGNOSIS — E785 Hyperlipidemia, unspecified: Secondary | ICD-10-CM

## 2021-04-10 DIAGNOSIS — Z9861 Coronary angioplasty status: Secondary | ICD-10-CM

## 2021-04-10 DIAGNOSIS — I10 Essential (primary) hypertension: Secondary | ICD-10-CM

## 2021-04-10 DIAGNOSIS — I2119 ST elevation (STEMI) myocardial infarction involving other coronary artery of inferior wall: Secondary | ICD-10-CM

## 2021-04-10 DIAGNOSIS — E669 Obesity, unspecified: Secondary | ICD-10-CM

## 2021-04-10 DIAGNOSIS — E8881 Metabolic syndrome: Secondary | ICD-10-CM

## 2021-04-10 LAB — LIPID PANEL
Chol/HDL Ratio: 3.8 ratio (ref 0.0–5.0)
Cholesterol, Total: 184 mg/dL (ref 100–199)
HDL: 49 mg/dL (ref >39)
LDL Chol Calc (NIH): 109 mg/dL — ABNORMAL HIGH (ref 0–99)
Triglycerides: 148 mg/dL (ref 0–149)
VLDL Cholesterol Cal: 26 mg/dL (ref 5–40)

## 2021-04-10 LAB — COMPREHENSIVE METABOLIC PANEL
ALT: 18 IU/L (ref 0–44)
AST: 14 IU/L (ref 0–40)
Albumin/Globulin Ratio: 2.1 (ref 1.2–2.2)
Albumin: 4.6 g/dL (ref 3.8–4.8)
Alkaline Phosphatase: 86 IU/L (ref 44–121)
BUN/Creatinine Ratio: 21 (ref 10–24)
BUN: 18 mg/dL (ref 8–27)
Bilirubin Total: 0.5 mg/dL (ref 0.0–1.2)
CO2: 24 mmol/L (ref 20–29)
Calcium: 9.3 mg/dL (ref 8.6–10.2)
Chloride: 105 mmol/L (ref 96–106)
Creatinine, Ser: 0.87 mg/dL (ref 0.76–1.27)
Globulin, Total: 2.2 g/dL (ref 1.5–4.5)
Glucose: 91 mg/dL (ref 70–99)
Potassium: 4.6 mmol/L (ref 3.5–5.2)
Sodium: 140 mmol/L (ref 134–144)
Total Protein: 6.8 g/dL (ref 6.0–8.5)
eGFR: 96 mL/min/{1.73_m2} (ref >59)

## 2021-04-10 MED ORDER — LIVALO 4 MG PO TABS: 4.0000 mg | ORAL_TABLET | Freq: Every day | ORAL | 3 refills | Status: DC

## 2021-04-10 MED ORDER — LOSARTAN POTASSIUM 25 MG PO TABS: 12.5000 mg | ORAL_TABLET | Freq: Every day | ORAL | 3 refills | Status: DC

## 2021-04-10 MED ORDER — LOSARTAN POTASSIUM 25 MG PO TABS: 25.0000 mg | ORAL_TABLET | Freq: Every day | ORAL | 3 refills | Status: DC

## 2021-04-10 NOTE — Progress Notes (Signed)
Primary Care Provider: [name], MD Cardiologist: Bryan Lemma, MD Electrophysiologist: None  Clinic Note: Chief Complaint  Patient presents with   Follow-up    Delayed annual - now ~18 month    Coronary Artery Disease    No Angina   ===================================  ASSESSMENT/PLAN   Problem List Items Addressed This Visit       Cardiology Problems   CAD S/P -PCI: PCI RCA during STEMI, staged PCI to LAD residual lesion (Abn Nuc ST) - Primary (Chronic)    9-1/2 years out from MI and staged PCI to the LAD.  Patent stents in 2019. He is still on aspirin and Plavix-discussed that he could probably stop aspirin.  Plan: Continue low-dose Toprol 25 mg daily and add losartan 12.5 mg daily Currently on Livalo and fenofibrate.  Evaluating for possible inclisiran versus PCSK9 inhibitor. On aspirin and Plavix.  Okay to hold or stop aspirin. Okay to hold Plavix 5 to 7 days preop for surgeries or procedures.       Relevant Medications   Pitavastatin Calcium (LIVALO) 4 MG TABS   losartan (COZAAR) 25 MG tablet   Other Relevant Orders   EKG 12-Lead (Completed)   Lipid panel (Completed)   Comprehensive metabolic panel (Completed)   ST elevation myocardial infarction (STEMI) of inferior wall -  PCI to RCA with staged PCI to LAD lesion (Chronic)    9-1/2 years out from inferior STEMI with RCA PCI.  Doing remarkably well.  RCA stent remained patent by cath in with normal EF/no WMA in May 2019.  No recurrent angina or heart failure symptoms.  Is on minimal medications.  Doing well.      Relevant Medications   Pitavastatin Calcium (LIVALO) 4 MG TABS   losartan (COZAAR) 25 MG tablet   Hyperlipidemia with target LDL less than 70 (Chronic)    Seems to be starting to have some memory issues with Livalo.  He was way over due for labs to be checked.  Checked this morning-LDL 109 which is only slightly lower than it was in November 2020.  Based on the fact that he is not at  goal, I suspect he will need additional management options.  Would be a good candidate for inclisiran versus PCSK9 inhibitor.  Have referred him to CVRR (Cardiovascular Risk Reduction ) Clinical Pharmacist [facility]  For now we will continue Livalo along with fenofibrate (triglycerides borderline goal), but may need to reduce dose of Livalo because of memory issues.  Thankfully, no myalgias.      Relevant Medications   Pitavastatin Calcium (LIVALO) 4 MG TABS   losartan (COZAAR) 25 MG tablet   Other Relevant Orders   EKG 12-Lead (Completed)   Lipid panel (Completed)   Comprehensive metabolic panel (Completed)   Essential hypertension (Chronic)    Blood pressure is little higher today than it had been.  He is on low-dose beta-blocker.  We will add low-dose ARB-losartan 12.5 mg daily.  Check baseline CMP.      Relevant Medications   Pitavastatin Calcium (LIVALO) 4 MG TABS   losartan (COZAAR) 25 MG tablet   Other Relevant Orders   Lipid panel (Completed)   Comprehensive metabolic panel (Completed)     Other   Obesity (BMI 30-39.9) (Chronic)    Notable weight gain.  We talked important it is to get back into the exercise routine and weight loss.  Whenever this happens, we are able to back off of medications.  I suspect it was  well both his blood pressure and lipids.  Would like to see him back down into the 220s, at a minimum..      Relevant Orders   Lipid panel (Completed)   Comprehensive metabolic panel (Completed)   Metabolic syndrome, obese, elevated glucose, Hgb A1c 6.1 (Chronic)    Weight back up.  Thankfully, triglycerides and HDL look pretty good.  Blood pressure also seems to be controlled.  Plan: Continue combination Livalo and fibrate for now, but low threshold to add PCSK9 Dr. Quay Burow based on lipid panel. Diet and exercise for weight loss. Add low-dose losartan to Toprol.      ===================================  HPI:    Jason Mathis is a 66 y.o.  male with a PMH below who presents today for 103 month f/u  (Rescheduled from Jan 05, 2021 - mother died then had PNA).  History of Inferior MI/CAD - 07/2011 (initial PCI to occluded RCA) Staged LAD PCI after abnormal Myoview to evaluate questionable LAD lesion 07/2017: Cath with Patent Stents.  Echo normal EF. CRFs of HTN, HLD  (statin intolerance 2/2 memory issues -- on low dose Livalo) & Obesity (wgt has been up & down)  Unk Pinto was last seen on December 26, 2019.  He had had a hospitalization in June 2021 with left upper lobe pneumonia, fever.  Was discharged on amoxicillin and azithromycin along with Tessalon Perles.  Would offer COVID.  He had lost all the data 220 pounds only to gain back to 242 pounds.  This happened because he "got out of the habit of exercising", which usually entailed kayaking.  Also less dietary discretion.  Denied any chest pain or pressure or heart failure symptoms.  Recent Hospitalizations: None  Reviewed  CV studies:    The following studies were reviewed today: (if available, images/films reviewed: From Epic Chart or Care Everywhere)  No new studies  Interval History:   Lane Callais returns for what is a very delayed annual follow-up.  He has had a long 9 months.  His mother died after a prolonged stretch of several months in 01-05-2021.  Shortly after that he developed pneumonia which took a while to clear up.  Unfortunately, as result of all of this, he has not been sedentary for over 6 months.  He is weight has gone back up after being as far down as 216 pounds.  He has become very deconditioned.  With increased weight, he does have some exertional dyspnea but no chest pain or pressure.  He did note that he retired last year and has been enjoyed having more freedom.  He and his wife are traveling more and spending more time with grandkids.  They like to go down to the beach and go on verifications with the grandkids.  He really is pretty much asymptomatic from a  cardiac standpoint.  CV Review of Symptoms (Summary) Cardiovascular ROS: no chest pain or dyspnea on exertion positive for - -somewhat decreased energy this deconditioning.  This is associated with a weight gain.  Clearly has some exercise intolerance with some exertional dyspnea but not with routine activity. negative for - edema, irregular heartbeat, orthopnea, palpitations, paroxysmal nocturnal dyspnea, rapid heart rate, shortness of breath, or lightheadedness or dizziness, syncope/near syncope or TIA/amaurosis fugax, claudication  REVIEWED OF SYSTEMS   Review of Systems  Constitutional:  Positive for malaise/fatigue (Deconditioned.  Has now become "lazy "). Negative for weight loss (Weight gain).  HENT:  Negative for congestion and nosebleeds.   Respiratory:  Negative  for cough and shortness of breath.   Cardiovascular:        Per HPI  Genitourinary:  Negative for dysuria and hematuria.  Musculoskeletal:  Negative for back pain, falls and myalgias.  Neurological:  Negative for dizziness and focal weakness.  Psychiatric/Behavioral:  Positive for memory loss (Not significant, but a little bit more than he has had). Negative for depression. The patient is not nervous/anxious and does not have insomnia.    I have reviewed and (if needed) personally updated the patient's problem list, medications, allergies, past medical and surgical history, social and family history.   PAST MEDICAL HISTORY   Past Medical History:  Diagnosis Date   CAD S/P percutaneous coronary angioplasty 07/14/2011   Inferior STEMI 07/2011 - Promus Element DES 3 x 16 (3.25 mm) for 100% ost RCA, residual ~60-80% p-m LAD disease noted -> post MI Myoview with Anterior Ischemia --> (09/07/2011) Staged PCI of the LAD - Promus Element DES 4.0x66mm (4.43mm) --> stents patent of Cath 07/14/2017   Chronic bronchitis ([place])    "used to get it every year til a few years ago" (09/07/11)   Dyslipidemia, goal LDL below 70 07/15/2011   H/O  echocardiogram 07/15/2011   Normal EF 55-60%; no WMA   History of pneumonia 2003   History of ST elevation myocardial infarction (STEMI) of inferior wall 07/15/2011   Culprit lesion 100% ostRCA: PCI of ost-prox RCA; noted p-m LAD ~70% (planned OP eval - Myoview --> staged PCI)   Hypertension    Kidney stones    Metabolic syndrome    Hgb AiC 6.1 -- low HDL, elevated blood glucose, hypertension   Obesity (BMI 30-39.9)    Syncope and collapse April 2014   Post-micturition with orthostatic tendencies.    PAST SURGICAL HISTORY   Past Surgical History:  Procedure Laterality Date   ANAL SPHINCTEROTOMY  1996   KNEE ARTHROSCOPY  1999; 9/20099   r/t left knee meniscal tear   LEFT HEART CATH AND CORONARY ANGIOGRAPHY N/A 07/14/2017   Procedure: LEFT HEART CATH AND CORONARY ANGIOGRAPHY;  Surgeon: Lorretta Harp, MD;  Location: [facility]; ; Ostial-proximal RCA stent patent.  Proximal-mid LAD stent patent.  Normal EF 55-60%.   LEFT HEART CATHETERIZATION WITH CORONARY ANGIOGRAM N/A 07/14/2011   Procedure: LEFT HEART CATHETERIZATION WITH CORONARY ANGIOGRAM;  Surgeon: Leonie Man, MD;  Location: [facility] C[facility] LAB;; Inf STEMI: 100% pRCA (PCI) -> 30% mid - RPDA & RPL1, 80% pLDA-60% (@ D1 that is med caliber bifurcating vessel ~20%), patent large RI & non-dom Cx-Lat OM.   LEFT HEART CATHETERIZATION WITH CORONARY ANGIOGRAM N/A 09/07/2011   Procedure: LEFT HEART CATHETERIZATION WITH CORONARY ANGIOGRAM;  Surgeon: Leonie Man, MD;  Location: [facility] CATH LAB;; widely patent RCA stent with 30% mid disease.  Persistent LAD roughly 80%, 60% lesions surrounding trifurcation into the large D1 and S/P 1 ([place] 1,0,1-with minimal involvement of the diagonal branch.)  FFR highly positive.  Widely patent RI  & Cx-LatOM   LITHOTRIPSY  1999   PERCUTANEOUS CORONARY STENT INTERVENTION (PCI-S)  07/14/2011   Procedure: PERCUTANEOUS CORONARY STENT INTERVENTION (PCI-S);  Surgeon: Leonie Man, MD;  Location: [facility] CATH LAB;;;  INF STEMI --> PCI of 100% RCA with Promus Element 3.0 mm x 16 mm DES; final diameter 3.25 mm   PERCUTANEOUS CORONARY STENT INTERVENTION (PCI-S) Right 09/07/2011   Procedure: PERCUTANEOUS CORONARY STENT INTERVENTION (PCI-S);  Surgeon: Leonie Man, MD;  Location: [facility] CATH LAB;; Staged PCI of  p-mLAD  with Promus Element DES 4.0x6828mm postdilated to 4.707mm (Abn Myoview & highly + FFR)   TONSILLECTOMY  1968   TRANSTHORACIC ECHOCARDIOGRAM  07/14/2017   EF 55 to 60%.  No R WMA.  GR 1 DD.  Mild LA dilation.  Otherwise normal   Cath 07/2017   There is no immunization history on file for this patient.  MEDICATIONS/ALLERGIES   Current Meds  Medication Sig   ALPRAZolam (XANAX) 0.25 MG tablet Take 0.25 mg by mouth at bedtime.   aspirin EC 81 MG EC tablet Take 1 tablet (81 mg total) by mouth daily.   Aspirin-Acetaminophen-Caffeine (GOODY HEADACHE PO) Take 1 packet by mouth as needed (for pain/discomfort or headaches).    cetirizine (ZYRTEC) 10 MG tablet Take 10 mg by mouth daily.   clopidogrel (PLAVIX) 75 MG tablet TAKE 1 TABLET BY MOUTH EVERY DAY   fenofibrate 54 MG tablet TAKE 1 TABLET BY MOUTH DAILY   FLUoxetine (PROZAC) 20 MG capsule Take 20 mg by mouth daily.   metoprolol succinate (TOPROL-XL) 25 MG 24 hr tablet TAKE 1 TABLET (25 MG TOTAL) BY MOUTH DAILY.   nitroGLYCERIN (NITROSTAT) 0.4 MG SL tablet Place 1 tablet (0.4 mg total) under the tongue every 5 (five) minutes as needed for chest pain.   [DISCONTINUED] losartan (COZAAR) 25 MG tablet Take 1 tablet (25 mg total) by mouth daily.   [DISCONTINUED] Pitavastatin Calcium (LIVALO) 4 MG TABS Take 1 tablet (4 mg total) by mouth daily. KEEP UPCOMING APPOINTMENT FOR FUTURE REFILLS    Allergies  Allergen Reactions   Atorvastatin Other (See Comments)    Memory issues   Ezetimibe Other (See Comments)    Myalgias   Pravastatin Other (See Comments)    Memory issues   Rosuvastatin Other (See Comments)    Memory issues    SOCIAL HISTORY/FAMILY  HISTORY   Reviewed in Epic:  Pertinent findings:  Social History   Tobacco Use   Smoking status: Never   Smokeless tobacco: Former    Types: Chew    Quit date: 03/09/2003  Substance Use Topics   Alcohol use: Yes    Alcohol/week: 7.0 standard drinks    Types: 7 Glasses of wine per week    Comment: 11/28/2012-occasional glass of wine, 09/07/11 "nothing since MI 07/14/11"   Drug use: No   Social History   Social History Narrative   Married, father of 2. He is a Scientist, research (physical sciences)life coach counselor. He exercises routinely about 3 days a week for about 60 minutes a day. Does not smoke does not drink. He has although the weight due to less frequent exercise and dietary modification.   He has a PhD in Animal nutritionistntomology; but currently works as a Oceanographerbusiness/professional coach.    OBJCTIVE -PE, EKG, labs   Wt Readings from Last 3 Encounters:  04/10/21 251 lb (113.9 kg)  12/26/19 262 lb (118.8 kg)  08/28/19 240 lb (108.9 kg)    Physical Exam: BP (!) 136/94    Pulse 74    Ht 6\' 2"  (1.88 m)    Wt 251 lb (113.9 kg)    SpO2 96%    BMI 32.23 kg/m   blood pressure at home this morning was 118/72 mmHg. Physical Exam Vitals reviewed.  Constitutional:      General: He is not in acute distress.    Appearance: Normal appearance. He is obese. He is not ill-appearing or toxic-appearing.     Comments: Notable weight gain.  HENT:     Head: Normocephalic and atraumatic.  Neck:     Vascular: No carotid bruit or JVD.  Cardiovascular:     Rate and Rhythm: Normal rate and regular rhythm. No extrasystoles are present.    Chest Wall: PMI is not displaced.     Pulses: Normal pulses.     Heart sounds: S1 normal and S2 normal. Heart sounds are distant. No murmur heard.   No friction rub. No gallop.  Pulmonary:     Effort: Pulmonary effort is normal. No respiratory distress.     Breath sounds: Normal breath sounds. No wheezing, rhonchi or rales.  Musculoskeletal:        General: No swelling. Normal range of motion.     Cervical  back: Normal range of motion and neck supple.  Skin:    General: Skin is warm and dry.  Neurological:     General: No focal deficit present.     Mental Status: He is alert and oriented to person, place, and time. Mental status is at baseline.     Gait: Gait normal.  Psychiatric:        Mood and Affect: Mood normal.        Behavior: Behavior normal.        Thought Content: Thought content normal.        Judgment: Judgment normal.     Adult ECG Report  Rate: 74;  Rhythm: normal sinus rhythm and anterior Mikan age-indeterminate.  Low voltage. ; Normal axis, intervals and durations.  Narrative Interpretation: Stable/normal.  Recent Labs: Labs ordered today.  Reviewed. Lab Results  Component Value Date   CHOL 184 04/10/2021   HDL 49 04/10/2021   LDLCALC 109 (H) 04/10/2021   TRIG 148 04/10/2021   CHOLHDL 3.8 04/10/2021   Lab Results  Component Value Date   CREATININE 0.87 04/10/2021   BUN 18 04/10/2021   NA 140 04/10/2021   K 4.6 04/10/2021   CL 105 04/10/2021   CO2 24 04/10/2021   CBC Latest Ref Rng & Units 08/28/2019 07/14/2017 07/13/2017  WBC 4.0 - 10.5 K/uL 5.8 6.7 11.2(H)  Hemoglobin 13.0 - 17.0 g/dL 12.8(L) 13.8 15.1  Hematocrit 39.0 - 52.0 % 38.7(L) 41.2 44.8  Platelets 150 - 400 K/uL 180 186 209    Lab Results  Component Value Date   HGBA1C 5.5 07/14/2017   Lab Results  Component Value Date   TSH 0.909 07/15/2011    ==================================================  COVID-19 Education: The signs and symptoms of COVID-19 were discussed with the patient and how to seek care for testing (follow up with PCP or arrange E-visit).    I spent a total of 26 minutes with the patient spent in direct patient consultation.  Additional time spent with chart review  / charting (studies, outside notes, etc): 16 min Total Time: 42 min  Current medicines are reviewed at length with the patient today.  (+/- concerns) none  This visit occurred during the SARS-CoV-2 public  health emergency.  Safety protocols were in place, including screening questions prior to the visit, additional usage of staff PPE, and extensive cleaning of exam room while observing appropriate contact time as indicated for disinfecting solutions.  Notice: This dictation was prepared with Dragon dictation along with smart phrase technology. Any transcriptional errors that result from this process are unintentional and may not be corrected upon review.  Studies Ordered:   Orders Placed This Encounter  Procedures   Lipid panel   Comprehensive metabolic panel   EKG [id number]    Patient Instructions / Medication  Changes & Studies & Tests Ordered   Patient Instructions  Medication Instructions:   Start taking Losartan 12.5 mg   ( 1/2 tablet of 25 mg ) daily   *If you need a refill on your cardiac medications before your next appointment, please call your pharmacy*   Lab Work: Lipid cmp If you have labs (blood work) drawn today and your tests are completely normal, you will receive your results only by: [place] (if you have MyChart) OR A paper copy in the mail If you have any lab test that is abnormal or we need to change your treatment, we will call you to review the results.   Testing/Procedures: Not needed   Follow-Up: At [facility], you and your health needs are our priority.  As part of our continuing mission to provide you with exceptional heart care, we have created designated Provider Care Teams.  These Care Teams include your primary Cardiologist (physician) and Advanced Practice Providers (APPs -  Physician Assistants and Nurse Practitioners) who all work together to provide you with the care you need, when you need it.     Your next appointment:   7 to 8 month(s)  The format for your next appointment:   In Person  Provider:   Glenetta Hew, MD    Other Instructions      Glenetta Hew, M.D., M.S. Interventional Cardiologist   Pager #  [phone] Phone # [phone] [address] [place], [place] [address]   Thank you for choosing Heartcare at [facility]!!

## 2021-04-10 NOTE — Patient Instructions (Addendum)
Medication Instructions:   Start taking Losartan 12.5 mg   ( 1/2 tablet of 25 mg ) daily   *If you need a refill on your cardiac medications before your next appointment, please call your pharmacy*   Lab Work: Lipid cmp If you have labs (blood work) drawn today and your tests are completely normal, you will receive your results only by: MyChart Message (if you have MyChart) OR A paper copy in the mail If you have any lab test that is abnormal or we need to change your treatment, we will call you to review the results.   Testing/Procedures: Not needed   Follow-Up: At [facility], you and your health needs are our priority.  As part of our continuing mission to provide you with exceptional heart care, we have created designated Provider Care Teams.  These Care Teams include your primary Cardiologist (physician) and Advanced Practice Providers (APPs -  Physician Assistants and Nurse Practitioners) who all work together to provide you with the care you need, when you need it.     Your next appointment:   7 to 8 month(s)  The format for your next appointment:   In Person  Provider:   Bryan Lemma, MD    Other Instructions

## 2021-04-13 ENCOUNTER — Other Ambulatory Visit: Payer: Self-pay | Admitting: *Deleted

## 2021-04-13 ENCOUNTER — Encounter: Payer: Self-pay | Admitting: Cardiology

## 2021-04-13 DIAGNOSIS — E785 Hyperlipidemia, unspecified: Secondary | ICD-10-CM

## 2021-04-13 NOTE — Assessment & Plan Note (Signed)
Blood pressure is little higher today than it had been.  He is on low-dose beta-blocker.  We will add low-dose ARB-losartan 12.5 mg daily.  Check baseline CMP.

## 2021-04-13 NOTE — Assessment & Plan Note (Signed)
9-1/2 years out from MI and staged PCI to the LAD.  Patent stents in 2019. He is still on aspirin and Plavix-discussed that he could probably stop aspirin.  Plan:  Continue low-dose Toprol 25 mg daily and add losartan 12.5 mg daily  Currently on Livalo and fenofibrate.  Evaluating for possible inclisiran versus PCSK9 inhibitor.  On aspirin and Plavix.  Okay to hold or stop aspirin.  Okay to hold Plavix 5 to 7 days preop for surgeries or procedures.

## 2021-04-13 NOTE — Assessment & Plan Note (Signed)
Weight back up.  Thankfully, triglycerides and HDL look pretty good.  Blood pressure also seems to be controlled.  Plan: Continue combination Livalo and fibrate for now, but low threshold to add PCSK9 Dr. Nanetta Batty based on lipid panel. Diet and exercise for weight loss. Add low-dose losartan to Toprol.

## 2021-04-13 NOTE — Assessment & Plan Note (Signed)
Seems to be starting to have some memory issues with Livalo.  He was way over due for labs to be checked.  Checked this morning-LDL 109 which is only slightly lower than it was in November 2020.  Based on the fact that he is not at goal, I suspect he will need additional management options.  Would be a good candidate for inclisiran versus PCSK9 inhibitor.  Have referred him to CVRR (Cardiovascular Risk Reduction ) Clinical Pharmacist run [facility]  For now we will continue Livalo along with fenofibrate (triglycerides borderline goal), but may need to reduce dose of Livalo because of memory issues.  Thankfully, no myalgias.

## 2021-04-13 NOTE — Assessment & Plan Note (Addendum)
9-1/2 years out from inferior STEMI with RCA PCI.  Doing remarkably well.  RCA stent remained patent by cath in with normal EF/no WMA in May 2019.  No recurrent angina or heart failure symptoms.  Is on minimal medications.  Doing well.

## 2021-04-13 NOTE — Assessment & Plan Note (Signed)
Notable weight gain.  We talked important it is to get back into the exercise routine and weight loss.  Whenever this happens, we are able to back off of medications.  I suspect it was well both his blood pressure and lipids.  Would like to see him back down into the 220s, at a minimum.Marland Kitchen

## 2021-05-05 ENCOUNTER — Telehealth: Payer: Self-pay | Admitting: Cardiology

## 2021-05-05 MED ORDER — FENOFIBRATE 54 MG PO TABS: 54.0000 mg | ORAL_TABLET | Freq: Every day | ORAL | 3 refills | Status: DC

## 2021-05-05 MED ORDER — METOPROLOL SUCCINATE ER 25 MG PO TB24: 25.0000 mg | ORAL_TABLET | Freq: Every day | ORAL | 3 refills | Status: DC

## 2021-05-05 MED ORDER — FLUOXETINE HCL 20 MG PO CAPS: 20.0000 mg | ORAL_CAPSULE | Freq: Every day | ORAL | 3 refills | Status: AC

## 2021-05-05 MED ORDER — CLOPIDOGREL BISULFATE 75 MG PO TABS: 75.0000 mg | ORAL_TABLET | Freq: Every day | ORAL | 3 refills | Status: DC

## 2021-05-05 NOTE — Telephone Encounter (Signed)
Refills has been sent to the pharmacy. 

## 2021-05-05 NOTE — Telephone Encounter (Signed)
Pt c/o medication issue:  1. Name of Medication:  Pitavastatin Calcium (LIVALO) 4 MG TABS Take 1 tablet (4 mg total) by mouth daily.  metoprolol succinate (TOPROL-XL) 25 MG 24 hr tablet TAKE 1 TABLET (25 MG TOTAL) BY MOUTH DAILY.  clopidogrel (PLAVIX) 75 MG tablet TAKE 1 TABLET BY MOUTH EVERY DAY  fenofibrate 54 MG tablet TAKE 1 TABLET BY MOUTH DAILY  2. How are you currently taking this medication (dosage and times per day)? Listed above  3. Are you having a reaction (difficulty breathing--STAT)? No   4. What is your medication issue? Pt needs these medication transferred to new pharmacy  [facility] ([facility] ) - [place], [place] - [address] Phone:  [phone]  Fax:  [phone]

## 2021-05-07 ENCOUNTER — Other Ambulatory Visit: Payer: Self-pay

## 2021-05-07 MED ORDER — LIVALO 4 MG PO TABS: 4.0000 mg | ORAL_TABLET | Freq: Every day | ORAL | 3 refills | Status: AC

## 2021-05-08 ENCOUNTER — Telehealth: Payer: Self-pay | Admitting: Pharmacist

## 2021-05-08 ENCOUNTER — Other Ambulatory Visit: Payer: Self-pay

## 2021-05-08 ENCOUNTER — Ambulatory Visit (INDEPENDENT_AMBULATORY_CARE_PROVIDER_SITE_OTHER): Payer: Medicare Other | Admitting: Pharmacist

## 2021-05-08 ENCOUNTER — Encounter: Payer: Self-pay | Admitting: Pharmacist

## 2021-05-08 DIAGNOSIS — I2119 ST elevation (STEMI) myocardial infarction involving other coronary artery of inferior wall: Secondary | ICD-10-CM

## 2021-05-08 DIAGNOSIS — I2510 Atherosclerotic heart disease of native coronary artery without angina pectoris: Secondary | ICD-10-CM | POA: Diagnosis not present

## 2021-05-08 DIAGNOSIS — E785 Hyperlipidemia, unspecified: Secondary | ICD-10-CM

## 2021-05-08 DIAGNOSIS — G4700 Insomnia, unspecified: Secondary | ICD-10-CM | POA: Insufficient documentation

## 2021-05-08 DIAGNOSIS — T466X5A Adverse effect of antihyperlipidemic and antiarteriosclerotic drugs, initial encounter: Secondary | ICD-10-CM

## 2021-05-08 DIAGNOSIS — F329 Major depressive disorder, single episode, unspecified: Secondary | ICD-10-CM | POA: Insufficient documentation

## 2021-05-08 DIAGNOSIS — F332 Major depressive disorder, recurrent severe without psychotic features: Secondary | ICD-10-CM | POA: Insufficient documentation

## 2021-05-08 DIAGNOSIS — F419 Anxiety disorder, unspecified: Secondary | ICD-10-CM | POA: Insufficient documentation

## 2021-05-08 DIAGNOSIS — Z9861 Coronary angioplasty status: Secondary | ICD-10-CM

## 2021-05-08 DIAGNOSIS — N200 Calculus of kidney: Secondary | ICD-10-CM | POA: Insufficient documentation

## 2021-05-08 MED ORDER — REPATHA SURECLICK 140 MG/ML ~~LOC~~ SOAJ: 1.0000 mL | SUBCUTANEOUS | 3 refills | Status: DC

## 2021-05-08 NOTE — Telephone Encounter (Signed)
Pa for Repatha submitted. Key: [id number].  Approved through 11/08/21 ?

## 2021-05-08 NOTE — Progress Notes (Signed)
Patient ID: Jason Mathis                 DOB: [birth year]                    MRN: [id number] ? ? ? ? ?HPI: ?Jason Mathis is a 66 y.o. male patient referred to lipid clinic by Dr [name]. PMH is significant for CAD, STEMI, HLD, HTN and statin intolerance. ? ?Patient presents today in good spirits.  Follows a heart healthy diet. Eats the majority of his meals at home although he reports he does eat large portions. Does not eat processed foods or fast food.  Drinks alcohol.  Was formerly physically active. Kayak and paddleing 2.5 hours.  ? ?Has tried atorvastatin, pravastatin, and rosuvastatin.  All caused memory issues. 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 Now on pitavastatin which he can tolerate better but occasional forgets words.  Zetia caused muscle pain. ? ?Current Medications: Pitavastatin 4mg , fenofibrate 54mg   ? ?Intolerances:  ?Atorvastatin ?Zetia ?Pravastatin ?Rosuvastatin ? ?Risk Factors:  ?CAD ?Hx of STEMI ?HLD ? ?LDL goal: <70 ? ?Labs: TC 184, Trigs 148, HDL 49, LDL 109 (04/10/21 - on fenofibrate) ? ?Past Medical History:  ?Diagnosis Date  ? CAD S/P percutaneous coronary angioplasty 07/14/2011  ? Inferior STEMI 07/2011 - Promus Element DES 3 x 16 (3.25 mm) for 100% ost RCA, residual ~60-80% p-m LAD disease noted -> post MI Myoview with Anterior Ischemia --> (09/07/2011) Staged PCI of the LAD - Promus Element DES 4.0x25mm (4.23mm) --> stents patent of Cath 07/14/2017  ? Chronic bronchitis (HCC)   ? "used to get it every year til a few years ago" (09/07/11)  ? Dyslipidemia, goal LDL below 70 07/15/2011  ? H/O echocardiogram 07/15/2011  ? Normal EF 55-60%; no WMA  ? History of pneumonia 2003  ? History of ST elevation myocardial infarction (STEMI) of inferior wall 07/15/2011  ? Culprit lesion 100% ostRCA: PCI of ost-prox RCA; noted p-m LAD ~70% (planned OP eval - Myoview --> staged PCI)  ? Hypertension   ? Kidney stones   ? Metabolic syndrome   ? Hgb AiC 6.1 -- low HDL,  elevated blood glucose, hypertension  ? Obesity (BMI 30-39.9)   ? Syncope and collapse April 2014  ? Post-micturition with orthostatic tendencies.  ? ? ?Current Outpatient Medications on File Prior to Visit  ?Medication Sig Dispense Refill  ? aspirin EC 81 MG EC tablet Take 1 tablet (81 mg total) by mouth daily.    ? Aspirin-Acetaminophen-Caffeine (GOODY HEADACHE PO) Take 1 packet by mouth as needed (for pain/discomfort or headaches).     ? cetirizine (ZYRTEC) 10 MG tablet Take 10 mg by mouth daily.    ? clopidogrel (PLAVIX) 75 MG tablet Take 1 tablet (75 mg total) by mouth daily. 90 tablet 3  ? fenofibrate 54 MG tablet Take 1 tablet (54 mg total) by mouth daily. 90 tablet 3  ? FLUoxetine (PROZAC) 20 MG capsule Take 1 capsule (20 mg total) by mouth daily. 90 capsule 3  ? losartan (COZAAR) 25 MG tablet Take 0.5 tablets (12.5 mg total) by mouth daily. 45 tablet 3  ? metoprolol succinate (TOPROL-XL) 25 MG 24 hr tablet Take 1 tablet (25 mg total) by mouth daily. 90 tablet 3  ? nitroGLYCERIN (NITROSTAT) 0.4 MG SL tablet Place 1 tablet (0.4 mg total) under the tongue every 5 (five) minutes as needed for chest pain. 25 tablet 3  ?  Pitavastatin Calcium (LIVALO) 4 MG TABS Take 1 tablet (4 mg total) by mouth daily. 90 tablet 3  ? ?No current facility-administered medications on file prior to visit.  ? ? ?Allergies  ?Allergen Reactions  ? Atorvastatin Other (See Comments)  ?  Memory issues  ? Ezetimibe Other (See Comments)  ?  Myalgias  ? Pravastatin Other (See Comments)  ?  Memory issues  ? Rosuvastatin Other (See Comments)  ?  Memory issues  ? ? ?Assessment/Plan: ? ?1. Hyperlipidemia - Patient recent LDL 109 last month which is above goal of 70.  Since patient intolerant to statins, recommend PCSK9i.   ? ?Using demo pen, educated patient on mechanism of action, storage, site selection, administration, and possible adverse effects.  Patient voiced understanding and was able to demonstrate in room.  Patient would like to d/c  pitavastatin which I am agreeable with if Repatha is approved.  Recheck lipid panel in 2-3 months. ? ?Start Repatha 140mg  sq q 14 days ?D/c pitavastatin ?Recheck lipid panel in 203 months ? ? , PharmD, BCACP, CDCES, CPP ?[address], [address] ?[place], [place], [place] ?Phone: [phone], Fax: [phone]  ?  ?

## 2021-05-08 NOTE — Patient Instructions (Signed)
It was nice meeting you today ? ?We would like your LDL to be less than 70 ? ?We will start you on a new medication called Repatha ? ?You will inject one pen every 2 weeks ? ?I will complete the prior authorization for you and contact you when it is approved ? ?Once you start the medication we will recheck your cholesterol in 2-3 months ? ?Please call with any questions! ? ?[address], PharmD, BCACP, CDCES, CPP ?[address], [address] ?[place], [place], [address] ?Phone: [phone], Fax: [phone]  ?

## 2021-07-09 ENCOUNTER — Other Ambulatory Visit: Payer: Self-pay

## 2021-07-09 DIAGNOSIS — E785 Hyperlipidemia, unspecified: Secondary | ICD-10-CM

## 2021-07-09 DIAGNOSIS — I2119 ST elevation (STEMI) myocardial infarction involving other coronary artery of inferior wall: Secondary | ICD-10-CM

## 2021-07-09 DIAGNOSIS — I2510 Atherosclerotic heart disease of native coronary artery without angina pectoris: Secondary | ICD-10-CM

## 2021-07-09 LAB — LIPID PANEL
Chol/HDL Ratio: 2.7 ratio (ref 0.0–5.0)
Cholesterol, Total: 149 mg/dL (ref 100–199)
HDL: 55 mg/dL (ref >39)
LDL Chol Calc (NIH): 67 mg/dL (ref 0–99)
Triglycerides: 160 mg/dL — ABNORMAL HIGH (ref 0–149)
VLDL Cholesterol Cal: 27 mg/dL (ref 5–40)

## 2021-09-09 ENCOUNTER — Telehealth: Payer: Self-pay | Admitting: Cardiology

## 2021-09-09 ENCOUNTER — Other Ambulatory Visit: Payer: Self-pay

## 2021-09-09 MED ORDER — LOSARTAN POTASSIUM 25 MG PO TABS: 12.5000 mg | ORAL_TABLET | Freq: Every day | ORAL | 3 refills | Status: AC

## 2021-09-09 NOTE — Telephone Encounter (Signed)
*  STAT* If patient is at the pharmacy, call can be transferred to refill team.   1. Which medications need to be refilled? (please list name of each medication and dose if known)   losartan (COZAAR) 25 MG tablet (Expired)    2. Which pharmacy/location (including street and city if local pharmacy) is medication to be sent to?  [facility] ([facility] ) - [place], [place] - [address] Phone:  [phone]         3. Do they need a 30 day or 90 day supply?  90 day

## 2021-10-23 ENCOUNTER — Encounter (HOSPITAL_COMMUNITY): Payer: Self-pay

## 2021-10-23 ENCOUNTER — Other Ambulatory Visit: Payer: Self-pay

## 2021-10-23 ENCOUNTER — Emergency Department (HOSPITAL_COMMUNITY)
Admission: EM | Admit: 2021-10-23 | Discharge: 2021-10-23 | Disposition: A | Discharge status: Home / Self Care | Payer: Medicare Other | Attending: Emergency Medicine | Admitting: Emergency Medicine

## 2021-10-23 DIAGNOSIS — Z79899 Other long term (current) drug therapy: Secondary | ICD-10-CM | POA: Diagnosis not present

## 2021-10-23 DIAGNOSIS — I493 Ventricular premature depolarization: Secondary | ICD-10-CM | POA: Insufficient documentation

## 2021-10-23 DIAGNOSIS — Z7982 Long term (current) use of aspirin: Secondary | ICD-10-CM | POA: Insufficient documentation

## 2021-10-23 DIAGNOSIS — R42 Dizziness and giddiness: Secondary | ICD-10-CM

## 2021-10-23 DIAGNOSIS — Z7902 Long term (current) use of antithrombotics/antiplatelets: Secondary | ICD-10-CM | POA: Diagnosis not present

## 2021-10-23 DIAGNOSIS — I10 Essential (primary) hypertension: Secondary | ICD-10-CM | POA: Diagnosis not present

## 2021-10-23 DIAGNOSIS — R030 Elevated blood-pressure reading, without diagnosis of hypertension: Secondary | ICD-10-CM

## 2021-10-23 LAB — CBC
HCT: 43.2 % (ref 39.0–52.0)
Hemoglobin: 14.5 g/dL (ref 13.0–17.0)
MCH: 30.8 pg (ref 26.0–34.0)
MCHC: 33.6 g/dL (ref 30.0–36.0)
MCV: 91.7 fL (ref 80.0–100.0)
Platelets: 245 10*3/uL (ref 150–400)
RBC: 4.71 MIL/uL (ref 4.22–5.81)
RDW: 13.2 % (ref 11.5–15.5)
WBC: 5.5 10*3/uL (ref 4.0–10.5)
nRBC: 0 % (ref 0.0–0.2)

## 2021-10-23 LAB — BASIC METABOLIC PANEL
Anion gap: 8 (ref 5–15)
BUN: 16 mg/dL (ref 8–23)
CO2: 23 mmol/L (ref 22–32)
Calcium: 9 mg/dL (ref 8.9–10.3)
Chloride: 106 mmol/L (ref 98–111)
Creatinine, Ser: 0.94 mg/dL (ref 0.61–1.24)
GFR, Estimated: >60 mL/min (ref >60)
Glucose, Bld: 102 mg/dL — ABNORMAL HIGH (ref 70–99)
Potassium: 4.1 mmol/L (ref 3.5–5.1)
Sodium: 137 mmol/L (ref 135–145)

## 2021-10-23 LAB — MAGNESIUM: Magnesium: 2.1 mg/dL (ref 1.7–2.4)

## 2021-10-23 LAB — TROPONIN I (HIGH SENSITIVITY): Troponin I (High Sensitivity): 4 ng/L (ref <18)

## 2021-10-23 NOTE — ED Triage Notes (Signed)
Went to UC for bp being high and palpitation.  Patient has hx of afib.  Patient also cold [place] stopped prozac 2 months ago.

## 2021-10-23 NOTE — Discharge Instructions (Addendum)
You were seen in the emergency department for dizziness and irregular heart beat.  As we discussed your lab work looked reassuring today your EKG showed that you had some irregular heartbeats, but overall your rhythm looked regular.  I recommend having a sleep study done to evaluate for sleep apnea.  I have sent a referral to the clinic at [facility] and attached their contact information.  Sometimes insurance requires that this be sent by primary doctor, so I recommend discussing it with your PCP.  Continue to monitor how you're doing and return to the ER for new or worsening symptoms.

## 2021-10-23 NOTE — ED Notes (Signed)
Discharge instructions reviewed with patient. Patient verbalized understanding of instructions. Follow-up care and medications were reviewed. Patient ambulatory with steady gait. VSS upon discharge.  ?

## 2021-10-23 NOTE — ED Provider Triage Note (Signed)
Emergency Medicine Provider Triage Evaluation Note  Jason Mathis , a 66 y.o. male  was evaluated in triage.  Pt complains of intermittent dizziness for a few days. Was taking his BP at home and was seeing intermittent high and low readings, and said it detected an irregular heart beat. Has been trying to eat better and exercise more. Has been doing lots of yard work in the heat and doesn't think he had enough water. No syncope, sx worse when standing up quickly. Went to UC and they thought his EKG showed afib and sent him to ER for eval. Hx of MI s/p stent placement in 2013  Review of Systems  Positive: Intermittent dizziness Negative: Palpitations, CP, headache, syncope  Physical Exam  BP 126/78 (BP Location: Left Arm)   Pulse 89   Temp 98.2 F (36.8 C) (Oral)   Resp 18   Ht 6\' 2"  (1.88 m)   Wt 113.9 kg   SpO2 97%   BMI 32.23 kg/m  Gen:   Awake, no distress   Resp:  Normal effort  MSK:   Moves extremities without difficulty  Other:    Medical Decision Making  Medically screening exam initiated at 10:12 AM.  Appropriate orders placed.  Tylen Leverich was informed that the remainder of the evaluation will be completed by another provider, this initial triage assessment does not replace that evaluation, and the importance of remaining in the ED until their evaluation is complete.  EKG brought from UC, from my interpretation shows sinus rhythm with 1 PVC detected   Joyelle [name] T, PA-C 10/23/21 1015

## 2021-10-23 NOTE — ED Provider Notes (Signed)
[facility] EMERGENCY DEPARTMENT Provider Note   CSN: [id number] Arrival date & time: 10/23/21  0931     History  Chief Complaint  Patient presents with   Palpitations   Hypertension    Jason Mathis is a 66 y.o. male with history of STEMI s/p cardiac stent (2013), HTN, HLD who presents to the ER complaining of several days of intermittent dizziness. Symptoms are worse with working out in his garden and standing up quickly. Doesn't believe he has been hydrating enough. Has been checking his blood pressure at home and was having intermittent high readings. It also told him he was having an irregular heart beat. He went to UC today and they were concerned with his BP readings and his EKG was irregular. Pt denies CP, SOB, or palpitations. States his previous MI was painless.   Palpitations Associated symptoms: no chest pain, no nausea and no shortness of breath   Hypertension Pertinent negatives include no chest pain and no shortness of breath.       Home Medications Prior to Admission medications   Medication Sig Start Date End Date Taking? Authorizing Provider  aspirin EC 81 MG EC tablet Take 1 tablet (81 mg total) by mouth daily. 07/15/17   Leone Brand, NP  Aspirin-Acetaminophen-Caffeine (GOODY HEADACHE PO) Take 1 packet by mouth as needed (for pain/discomfort or headaches).     [provider]  cetirizine (ZYRTEC) 10 MG tablet Take 10 mg by mouth daily.    [provider]  clopidogrel (PLAVIX) 75 MG tablet Take 1 tablet (75 mg total) by mouth daily. 05/05/21   [name], MD  Evolocumab (REPATHA SURECLICK) 140 MG/ML SOAJ Inject 1 mL into the skin every 14 (fourteen) days. 05/08/21   [name], MD  fenofibrate 54 MG tablet Take 1 tablet (54 mg total) by mouth daily. 05/05/21   [name], MD  FLUoxetine (PROZAC) 20 MG capsule Take 1 capsule (20 mg total) by mouth daily. 05/05/21   [name], MD  losartan (COZAAR) 25 MG  tablet Take 0.5 tablets (12.5 mg total) by mouth daily. 09/09/21 12/08/21  [name], MD  metoprolol succinate (TOPROL-XL) 25 MG 24 hr tablet Take 1 tablet (25 mg total) by mouth daily. 05/05/21   [name], MD  nitroGLYCERIN (NITROSTAT) 0.4 MG SL tablet Place 1 tablet (0.4 mg total) under the tongue every 5 (five) minutes as needed for chest pain. 04/26/16   [name], MD  Pitavastatin Calcium (LIVALO) 4 MG TABS Take 1 tablet (4 mg total) by mouth daily. 05/07/21   [name], MD      Allergies    Atorvastatin, Ezetimibe, Pravastatin, and Rosuvastatin    Review of Systems   Review of Systems  Constitutional:  Negative for chills and fever.  Respiratory:  Negative for shortness of breath.   Cardiovascular:  Negative for chest pain and palpitations.  Gastrointestinal:  Negative for nausea.  Neurological:  Positive for light-headedness. Negative for syncope.  All other systems reviewed and are negative.   Physical Exam Updated Vital Signs BP 132/78   Pulse 75   Temp 97.9 F (36.6 C)   Resp 18   Ht 6\' 2"  (1.88 m)   Wt 113.9 kg   SpO2 97%   BMI 32.23 kg/m  Physical Exam Vitals and nursing note reviewed.  Constitutional:      Appearance: Normal appearance.  HENT:     Head: Normocephalic and atraumatic.  Eyes:     Conjunctiva/sclera: Conjunctivae normal.  Cardiovascular:     Rate and Rhythm: Normal rate and regular rhythm.  Pulmonary:     Effort: Pulmonary effort is normal. No respiratory distress.     Breath sounds: Normal breath sounds.  Abdominal:     General: There is no distension.     Palpations: Abdomen is soft.     Tenderness: There is no abdominal tenderness.  Skin:    General: Skin is warm and dry.  Neurological:     General: No focal deficit present.     Mental Status: He is alert.     ED Results / Procedures / Treatments   Labs (all labs ordered are listed, but only abnormal results are displayed) Labs Reviewed  BASIC METABOLIC  PANEL - Abnormal; Notable for the following components:      Result Value   Glucose, Bld 102 (*)    All other components within normal limits  CBC  MAGNESIUM  TROPONIN I (HIGH SENSITIVITY)    EKG EKG Interpretation  Date/Time:  Friday October 23 2021 09:44:23 EDT Ventricular Rate:  88 PR Interval:  158 QRS Duration: 112 QT Interval:  386 QTC Calculation: 467 R Axis:   -11 Text Interpretation: Sinus rhythm with frequent and consecutive Premature ventricular complexes Inferior infarct , age undetermined Abnormal ECG When compared with ECG of 28-Aug-2019 17:39, PREVIOUS ECG IS PRESENT Confirmed by Tretha Sciara [phone]) on 10/23/2021 12:42:26 PM  Radiology No results found.  Procedures Procedures    Medications Ordered in ED Medications - No data to display  ED Course/ Medical Decision Making/ A&P Clinical Course as of 10/23/21 1525  Fri Oct 23, 2021  1240 Stable 66 YOM with palpitations. HX of STEMI and HTN. I evaluated patient at bedside and had an extensive conversation with him and his wife.  Notably patient has been gaining weight, has been having worsening snoring at night and has had elevated blood pressure readings at home intermittently.  Today he presented to a urgent care because he had a feeling of dizziness while working in the garden yesterday and they had an EKG with a PVC and sent in for further care and management.  He insists that at no point has he had chest pain or shortness of breath even with the episode of dizziness yesterday.  He does have a history of STEMI with stents status post 10 years. Currently he is asymptomatic. Shared medical decision making with patient regarding ongoing care and management.  Discussed cross-sectional imaging versus delta troponins for his history of ACS and episode of dizziness yesterday.  Patient declined the above studies but was comfortable with single troponin testing and completion of electrolyte series. If troponin is  reassuring, electrolytes are reassuring or replaced, patient will be stable for outpatient care and management.  He has already messaged his PCP to follow-up in a sleep apnea clinic though we will also refer patient to assist with coordination of outpatient follow-up.  Additionally, he has messaged his cardiologist for all close outpatient care and management. No acute indication for further intervention based on this discussion. [CC]    Clinical Course User Index [CC] Tretha Sciara, MD                           Medical Decision Making Amount and/or Complexity of Data Reviewed Labs: ordered.  This patient is a 66 y.o. male  who presents to the ED for concern of  lightheadedness and irregular heart beat.   Differential diagnoses prior to evaluation: The emergent differential diagnosis includes, but is not limited to,  Cardiac arrhythmias, PVC/PAC, ACS, cardiomyopathy, CHF, MVP, pericarditis, valvular disease, panic/anxiety, somatic disorder, caffeine/stimulant use, medication side effect, anemia, hyperthyroidism, pulmonary embolism. This is not an exhaustive differential.   Past Medical History / Co-morbidities: STEMI s/p cardiac stent (2013), HTN, HLD  Additional history: Chart reviewed. Pertinent results include: STEMI in 2013, echocardiogram with normal EF  Physical Exam: Physical exam performed. The pertinent findings include: Normal vital signs. Ambulates without recurrence of symptoms.   Lab Tests/Imaging studies: I personally interpreted labs/imaging and the pertinent results include:  No leukocytosis, normal hemoglobin. Glucose 102, other normal electrolytes and normal kidney function. Magnesium 2.1. Troponin 4.   Cardiac monitoring: EKG obtained and interpreted by my attending physician which shows: sinus rhythm with frequent PVC's   Disposition: After consideration of the diagnostic results and the patients response to treatment, I feel that emergency department workup  does not suggest an emergent condition requiring admission or immediate intervention beyond what has been performed at this time. Suspect symptoms could've been related to orthostatic hypotension in the setting of dehydration. The plan is: discharge to home with referral for sleep study for likely sleep apnea and follow up with cardiology and PCP. The patient is safe for discharge and has been instructed to return immediately for worsening symptoms, change in symptoms or any other concerns.   Final Clinical Impression(s) / ED Diagnoses Final diagnoses:  Dizziness  Premature ventricular beats  Elevated blood pressure reading    Rx / DC Orders ED Discharge Orders          Ordered    Ambulatory referral to Sleep Studies        10/23/21 1352           Portions of this report may have been transcribed using voice recognition software. Every effort was made to ensure accuracy; however, inadvertent computerized transcription errors may be present.    Jeanella Flattery 10/23/21 1527    Glyn Ade, MD 10/23/21 1539

## 2021-10-23 NOTE — ED Notes (Signed)
Called lab regarding add on of troponin and magnesium. Per lab they will add on to previous lab work.

## 2022-01-15 ENCOUNTER — Telehealth: Payer: Self-pay | Admitting: Pharmacist

## 2022-01-15 NOTE — Telephone Encounter (Signed)
PA renewal request received Key: [id number] Submitted to [facility]

## 2022-03-05 ENCOUNTER — Other Ambulatory Visit: Payer: Self-pay | Admitting: Cardiology

## 2022-03-05 DIAGNOSIS — E785 Hyperlipidemia, unspecified: Secondary | ICD-10-CM

## 2022-03-05 DIAGNOSIS — I2119 ST elevation (STEMI) myocardial infarction involving other coronary artery of inferior wall: Secondary | ICD-10-CM

## 2022-03-05 DIAGNOSIS — I2510 Atherosclerotic heart disease of native coronary artery without angina pectoris: Secondary | ICD-10-CM

## 2022-04-23 ENCOUNTER — Other Ambulatory Visit: Payer: Self-pay | Admitting: Cardiology

## 2022-05-03 ENCOUNTER — Other Ambulatory Visit: Payer: Self-pay | Admitting: Cardiology

## 2022-06-25 ENCOUNTER — Other Ambulatory Visit: Payer: Self-pay | Admitting: Cardiology

## 2022-07-05 ENCOUNTER — Other Ambulatory Visit: Payer: Self-pay | Admitting: Cardiology

## 2022-07-30 ENCOUNTER — Other Ambulatory Visit: Payer: Self-pay | Admitting: Cardiology

## 2022-08-27 ENCOUNTER — Other Ambulatory Visit: Payer: Self-pay | Admitting: Cardiology

## 2022-09-23 ENCOUNTER — Other Ambulatory Visit: Payer: Self-pay | Admitting: Cardiology

## 2022-11-02 ENCOUNTER — Inpatient Hospital Stay: Payer: Medicare Other | Admitting: Genetic Counselor

## 2022-11-02 ENCOUNTER — Other Ambulatory Visit: Payer: Self-pay | Admitting: Genetic Counselor

## 2022-11-02 ENCOUNTER — Inpatient Hospital Stay: Payer: Medicare Other

## 2022-11-02 DIAGNOSIS — Z8041 Family history of malignant neoplasm of ovary: Secondary | ICD-10-CM

## 2022-11-02 DIAGNOSIS — Z1379 Encounter for other screening for genetic and chromosomal anomalies: Secondary | ICD-10-CM

## 2022-11-02 LAB — GENETIC SCREENING ORDER

## 2022-11-03 ENCOUNTER — Encounter: Payer: Self-pay | Admitting: Genetic Counselor

## 2022-11-03 NOTE — Progress Notes (Signed)
REFERRING PROVIDER: Self- Referral   PRIMARY PROVIDER:  [name], MD  PRIMARY REASON FOR VISIT:  1. Family history of ovarian cancer    HISTORY OF PRESENT ILLNESS:   Mr. Hartner, a 67 y.o. male, was seen for a  cancer genetics consultation due to a family history of cancer.  Mr. Kolton presents to clinic today to discuss the possibility of a hereditary predisposition to cancer, to discuss genetic testing, and to further clarify his future cancer risks, as well as potential cancer risks for family members.   Mr. Tovar is a 67 y.o. male with no personal history of cancer.    Past Medical History:  Diagnosis Date   CAD S/P percutaneous coronary angioplasty 07/14/2011   Inferior STEMI 07/2011 - Promus Element DES 3 x 16 (3.25 mm) for 100% ost RCA, residual ~60-80% p-m LAD disease noted -> post MI Myoview with Anterior Ischemia --> (09/07/2011) Staged PCI of the LAD - Promus Element DES 4.0x61mm (4.51mm) --> stents patent of Cath 07/14/2017   Chronic bronchitis (HCC)    "used to get it every year til a few years ago" (09/07/11)   Dyslipidemia, goal LDL below 70 07/15/2011   H/O echocardiogram 07/15/2011   Normal EF 55-60%; no WMA   History of pneumonia 2003   History of ST elevation myocardial infarction (STEMI) of inferior wall 07/15/2011   Culprit lesion 100% ostRCA: PCI of ost-prox RCA; noted p-m LAD ~70% (planned OP eval - Myoview --> staged PCI)   Hypertension    Kidney stones    Metabolic syndrome    Hgb AiC 6.1 -- low HDL, elevated blood glucose, hypertension   Obesity (BMI 30-39.9)    Syncope and collapse April 2014   Post-micturition with orthostatic tendencies.    Past Surgical History:  Procedure Laterality Date   ANAL SPHINCTEROTOMY  1996   KNEE ARTHROSCOPY  1999; 9/20099   r/t left knee meniscal tear   LEFT HEART CATH AND CORONARY ANGIOGRAPHY N/A 07/14/2017   Procedure: LEFT HEART CATH AND CORONARY ANGIOGRAPHY;  Surgeon: [name], MD;  Location: [facility]  INVASIVE CV LAB; ; Ostial-proximal RCA stent patent.  Proximal-mid LAD stent patent.  Normal EF 55-60%.   LEFT HEART CATHETERIZATION WITH CORONARY ANGIOGRAM N/A 07/14/2011   Procedure: LEFT HEART CATHETERIZATION WITH CORONARY ANGIOGRAM;  Surgeon: [name], MD;  Location: [facility] CATH LAB;; Inf STEMI: 100% pRCA (PCI) -> 30% mid - RPDA & RPL1, 80% pLDA-60% (@ D1 that is med caliber bifurcating vessel ~20%), patent large RI & non-dom Cx-Lat OM.   LEFT HEART CATHETERIZATION WITH CORONARY ANGIOGRAM N/A 09/07/2011   Procedure: LEFT HEART CATHETERIZATION WITH CORONARY ANGIOGRAM;  Surgeon: [name], MD;  Location: [facility] CATH LAB;; widely patent RCA stent with 30% mid disease.  Persistent LAD roughly 80%, 60% lesions surrounding trifurcation into the large D1 and S/P 1 ([place] 1,0,1-with minimal involvement of the diagonal branch.)  FFR highly positive.  Widely patent RI  & Cx-LatOM   LITHOTRIPSY  1999   PERCUTANEOUS CORONARY STENT INTERVENTION (PCI-S)  07/14/2011   Procedure: PERCUTANEOUS CORONARY STENT INTERVENTION (PCI-S);  Surgeon: [name], MD;  Location: [facility] CATH LAB;;; INF STEMI --> PCI of 100% RCA with Promus Element 3.0 mm x 16 mm DES; final diameter 3.25 mm   PERCUTANEOUS CORONARY STENT INTERVENTION (PCI-S) Right 09/07/2011   Procedure: PERCUTANEOUS CORONARY STENT INTERVENTION (PCI-S);  Surgeon: [name], MD;  Location: [facility] CATH LAB;; Staged PCI of  p-mLAD with Promus Element DES  4.0x44mm postdilated to 4.8mm (Abn Myoview & highly + FFR)   TONSILLECTOMY  1968   TRANSTHORACIC ECHOCARDIOGRAM  07/14/2017   EF 55 to 60%.  No R WMA.  GR 1 DD.  Mild LA dilation.  Otherwise normal    Social History   Socioeconomic History   Marital status: Married    Spouse name: Not on file   Number of children: 2   Years of education: PhD.    Highest education level: Not on file  Occupational History    Employer: [employer]  Tobacco Use   Smoking status: Never   Smokeless tobacco: Former    Types:  Chew    Quit date: 03/09/2003  Vaping Use   Vaping status: Never Used  Substance and Sexual Activity   Alcohol use: Not Currently    Alcohol/week: 7.0 standard drinks of alcohol    Types: 7 Glasses of wine per week    Comment: 11/28/2012-occasional glass of wine, 09/07/11 "nothing since MI 07/14/11"   Drug use: No   Sexual activity: Yes  Other Topics Concern   Not on file  Social History Narrative   Married, father of 2. He is a Scientist, research (physical sciences). He exercises routinely about 3 days a week for about 60 minutes a day. Does not smoke does not drink. He has although the weight due to less frequent exercise and dietary modification.   He has a PhD in Animal nutritionist; but currently works as a Oceanographer.   Social Determinants of Health   Financial Resource Strain: Not on file  Food Insecurity: Not on file  Transportation Needs: Not on file  Physical Activity: Not on file  Stress: Not on file  Social Connections: Unknown (12/12/2021)   [facility]   Social Network    Social Network: Not on file     FAMILY HISTORY:  We obtained a detailed, 4-generation family history.  Significant diagnoses are listed below: Family History  Problem Relation Age of Onset   Hyperlipidemia Mother    Coronary artery disease Father 18       MI at 51y/o - died   Ovarian cancer Sister 60   Colon cancer Maternal Aunt 92   Heart Problems Maternal Grandmother    Pneumonia Maternal Grandfather       Mr. Salzberg is unaware of previous family history of genetic testing for hereditary cancer risks. There is no reported Ashkenazi Jewish ancestry.   GENETIC COUNSELING ASSESSMENT: Mr. Quattrochi is a 67 y.o. male with a family history of cancer which is somewhat suggestive of a hereditary predisposition to cancer. We, therefore, discussed and recommended the following at today's visit.   DISCUSSION: We discussed that 5 - 10% of all cancer is hereditary. Approximately 20% of  ovarian cancer is hereditary with most cases of hereditary ovarian cancer associated with BRCA1/2.  There are other genes that can be associated with hereditary ovarian cancer syndromes.  We discussed that testing is beneficial for several reasons, including knowing about other cancer risks, identifying potential screening and risk-reduction options that may be appropriate, and to understanding if other family members could be at risk for cancer and allowing them to undergo genetic testing.  We reviewed the characteristics, features and inheritance patterns of hereditary cancer syndromes. We also discussed genetic testing, including the appropriate family members to test, the process of testing, insurance coverage and turn-around-time for results. We discussed the implications of a negative, positive, carrier and/or variant of uncertain significant result. We  discussed that negative results would be uninformative given that Mr. Handshoe does not have a personal history of cancer. We recommended Mr. Rising pursue genetic testing for a panel that contains genes associated with ovarian and colon cancer.  Mr. Wehrer was offered a common hereditary cancer panel (34 genes) and an expanded pan-cancer panel (71 genes). Mr. Robberson was informed of the benefits and limitations of each panel, including that expanded pan-cancer panels contain several genes that do not have clear management guidelines at this point in time.  We also discussed that as the number of genes included on a panel increases, the chances of variants of uncertain significance increases.  After considering the benefits and limitations of each gene panel, Mr. Guntrum elected to have [facility] CancerNext Panel.  The CancerNext gene panel offered by [employer] includes sequencing, rearrangement analysis, and RNA analysis for the following 34 genes:   APC, ATM, AXIN2, BARD1, BMPR1A, BRCA1, BRCA2, BRIP1, CDH1, CDK4, CDKN2A, CHEK2, DICER1, HOXB13, EPCAM, GREM1,  MLH1, MSH2, MSH3, MSH6, MUTYH, NF1, NTHL1, PALB2, PMS2, POLD1, POLE, PTEN, RAD51C, RAD51D, SMAD4, SMARCA4, STK11, and TP53.    Based on Mr. Whitfield family history of cancer, he meets medical criteria for genetic testing. Based on his insurance, Medicare A/B, the testing should be of no cost.  PLAN: After considering the risks, benefits, and limitations, Mr. Yeakey provided informed consent to pursue genetic testing and the blood sample was sent to [facility] for analysis of the CancerNext Panel. Results should be available within approximately 2-3 weeks' time, at which point they will be disclosed by telephone to Mr. Zunich, as will any additional recommendations warranted by these results. Mr. Stettner will receive a summary of his genetic counseling visit and a copy of his results once available. This information will also be available in Epic.   Mr. Pamplona questions were answered to his satisfaction today. Our contact information was provided should additional questions or concerns arise. Thank you for the referral and allowing [place] to share in the care of your patient.   Lalla Brothers, MS, [facility] Genetic Counselor [email or web address] (P) [phone]  The patient was seen for a total of 30 minutes in face-to-face genetic counseling. The patient brought his wife. Drs. Pamelia Hoit and/or Mosetta Putt were available to discuss this case as needed.  _______________________________________________________________________ For Office Staff:  Number of people involved in session: 2 Was an Intern/ student involved with case: no

## 2022-11-19 ENCOUNTER — Ambulatory Visit: Payer: Self-pay | Admitting: Genetic Counselor

## 2022-11-19 DIAGNOSIS — Z1379 Encounter for other screening for genetic and chromosomal anomalies: Secondary | ICD-10-CM

## 2022-11-19 NOTE — Progress Notes (Unsigned)
HPI:   Jason Mathis was previously seen in the [facility] due to a family history of cancer and concerns regarding a hereditary predisposition to cancer. Please refer to our prior cancer genetics clinic note for more information regarding our discussion, assessment and recommendations, at the time. Jason Mathis recent genetic test results were disclosed to him, as were recommendations warranted by these results. These results and recommendations are discussed in more detail below.  FAMILY HISTORY:  We obtained a detailed, 4-generation family history.  Significant diagnoses are listed below:      Family History  Problem Relation Age of Onset   Hyperlipidemia Mother     Coronary artery disease Father 27        MI at 51y/o - died   Ovarian cancer Sister 10   Colon cancer Maternal Aunt 92   Heart Problems Maternal Grandmother     Pneumonia Maternal Grandfather               Jason Mathis is unaware of previous family history of genetic testing for hereditary cancer risks. There is no reported Ashkenazi Jewish ancestry.    GENETIC TEST RESULTS:  The [facility] CancerNext Panel found no pathogenic mutations.   The CancerNext gene panel offered by [employer] includes sequencing, rearrangement analysis, and RNA analysis for the following 34 genes:   APC, ATM, AXIN2, BARD1, BMPR1A, BRCA1, BRCA2, BRIP1, CDH1, CDK4, CDKN2A, CHEK2, DICER1, HOXB13, EPCAM, GREM1, MLH1, MSH2, MSH3, MSH6, MUTYH, NF1, NTHL1, PALB2, PMS2, POLD1, POLE, PTEN, RAD51C, RAD51D, SMAD4, SMARCA4, STK11, and TP53.    The test report has been scanned into EPIC and is located under the Molecular Pathology section of the Results Review tab.  A portion of the result report is included below for reference. Genetic testing reported out on 11/16/2022.       Even though a pathogenic variant was not identified, possible explanations for the cancer in the family may include: There may be no hereditary risk for cancer in  the family. The cancers in his family may be due to other genetic or environmental factors. There may be a gene mutation in one of these genes that current testing methods cannot detect, but that chance is small. There could be another gene that has not yet been discovered, or that we have not yet tested, that is responsible for the cancer diagnoses in the family.  It is also possible there is a hereditary cause for the cancer in the family that Jason Mathis did not inherit.  Therefore, it is important to remain in touch with cancer genetics in the future so that we can continue to offer Jason Mathis the most up to date genetic testing.   ADDITIONAL GENETIC TESTING:  We discussed with Jason Mathis that his genetic testing was fairly extensive.  If there are genes identified to increase cancer risk that can be analyzed in the future, we would be happy to discuss and coordinate this testing at that time.    CANCER SCREENING RECOMMENDATIONS:  Jason Mathis test result is considered negative (normal).  This means that we have not identified a hereditary cause for his family history of cancer at this time.   An individual's cancer risk and medical management are not determined by genetic test results alone. Overall cancer risk assessment incorporates additional factors, including personal medical history, family history, and any available genetic information that may result in a personalized plan for cancer prevention and surveillance. Therefore, it is recommended he continue  to follow the cancer management and screening guidelines provided by his primary healthcare provider.  RECOMMENDATIONS FOR FAMILY MEMBERS:   Since he did not inherit a mutation in a cancer predisposition gene included on this panel, his children could not have inherited a mutation from him in one of these genes. Other members of the family may still carry a pathogenic variant in one of these genes that Jason Mathis did not inherit. Based on the  family history, we recommend his nephews have genetic counseling and testing.   FOLLOW-UP:  Cancer genetics is a rapidly advancing field and it is possible that new genetic tests will be appropriate for him and/or his family members in the future. We encouraged him to remain in contact with cancer genetics on an annual basis so we can update his personal and family histories and let him know of advances in cancer genetics that may benefit this family.   Our contact number was provided. Jason Mathis questions were answered to his satisfaction, and he knows he is welcome to call us at anytime with additional questions or concerns.   Lalla Brothers, MS, [facility] Genetic Counselor [email or web address] (P) [phone]

## 2022-11-22 ENCOUNTER — Telehealth: Payer: Self-pay | Admitting: Genetic Counselor

## 2022-11-22 ENCOUNTER — Encounter: Payer: Self-pay | Admitting: Genetic Counselor

## 2022-11-22 DIAGNOSIS — Z1379 Encounter for other screening for genetic and chromosomal anomalies: Secondary | ICD-10-CM | POA: Insufficient documentation

## 2022-11-22 NOTE — Telephone Encounter (Signed)
I contacted Mr. Dewit to discuss his genetic testing results. No pathogenic variants were identified in the 34 genes analyzed. Detailed clinic note to follow.  The test report has been scanned into EPIC and is located under the Molecular Pathology section of the Results Review tab.  A portion of the result report is included below for reference.   Lalla Brothers, MS, [facility] Genetic Counselor [email or web address] (P) [phone]

## 2022-11-24 ENCOUNTER — Encounter: Payer: Self-pay | Admitting: Genetic Counselor

## 2022-12-02 ENCOUNTER — Other Ambulatory Visit: Payer: Self-pay | Admitting: Cardiology

## 2023-01-20 ENCOUNTER — Other Ambulatory Visit: Payer: Self-pay | Admitting: Cardiology

## 2023-01-20 DIAGNOSIS — E785 Hyperlipidemia, unspecified: Secondary | ICD-10-CM

## 2023-01-20 DIAGNOSIS — I2510 Atherosclerotic heart disease of native coronary artery without angina pectoris: Secondary | ICD-10-CM

## 2023-01-20 DIAGNOSIS — I2119 ST elevation (STEMI) myocardial infarction involving other coronary artery of inferior wall: Secondary | ICD-10-CM

## 2023-04-29 ENCOUNTER — Other Ambulatory Visit: Payer: Self-pay | Admitting: Cardiology

## 2023-04-29 DIAGNOSIS — I2119 ST elevation (STEMI) myocardial infarction involving other coronary artery of inferior wall: Secondary | ICD-10-CM

## 2023-04-29 DIAGNOSIS — I2510 Atherosclerotic heart disease of native coronary artery without angina pectoris: Secondary | ICD-10-CM

## 2023-04-29 DIAGNOSIS — E785 Hyperlipidemia, unspecified: Secondary | ICD-10-CM

## 2023-09-19 ENCOUNTER — Other Ambulatory Visit: Payer: Self-pay | Admitting: Cardiology

## 2023-10-14 ENCOUNTER — Other Ambulatory Visit: Payer: Self-pay | Admitting: Cardiology

## 2023-10-28 ENCOUNTER — Other Ambulatory Visit: Payer: Self-pay | Admitting: Cardiology

## 2023-11-14 ENCOUNTER — Other Ambulatory Visit: Payer: Self-pay | Admitting: Cardiology

## 2024-01-17 ENCOUNTER — Other Ambulatory Visit: Payer: Self-pay | Admitting: Cardiology

## 2024-01-31 ENCOUNTER — Other Ambulatory Visit: Payer: Self-pay | Admitting: Cardiology
